# Patient Record
Sex: Female | Born: 1975 | Race: White | Hispanic: No | Smoking: Never smoker
Health system: Southern US, Community
[De-identification: ages and names within clinical notes are randomized; demographics above are authoritative.]

## PROBLEM LIST (undated history)

## (undated) ENCOUNTER — Inpatient Hospital Stay (HOSPITAL_COMMUNITY): Payer: Self-pay

## (undated) DIAGNOSIS — F419 Anxiety disorder, unspecified: Secondary | ICD-10-CM

## (undated) HISTORY — PX: DENTAL EXAMINATION UNDER ANESTHESIA: SHX1447

## (undated) HISTORY — PX: DENTAL SURGERY: SHX609

---

## 2008-11-24 ENCOUNTER — Emergency Department (HOSPITAL_COMMUNITY): Admission: EM | Admit: 2008-11-24 | Discharge: 2008-11-24 | Payer: Self-pay | Admitting: Family Medicine

## 2010-06-02 LAB — POCT URINALYSIS DIP (DEVICE)
Bilirubin Urine: NEGATIVE
Glucose, UA: NEGATIVE mg/dL
Specific Gravity, Urine: 1.02 (ref 1.005–1.030)
Urobilinogen, UA: 0.2 mg/dL (ref 0.0–1.0)
pH: 7 (ref 5.0–8.0)

## 2010-06-02 LAB — GC/CHLAMYDIA PROBE AMP, GENITAL
Chlamydia, DNA Probe: NEGATIVE
GC Probe Amp, Genital: NEGATIVE

## 2010-06-02 LAB — WET PREP, GENITAL
Clue Cells Wet Prep HPF POC: NONE SEEN
Trich, Wet Prep: NONE SEEN

## 2010-06-02 LAB — URINE CULTURE

## 2014-02-26 NOTE — L&D Delivery Note (Signed)
Patient was C/C/+2 and pushed for approx 120 minutes with epidural.   NSVD female infant, Apgars pending, weight 6#13.   The patient had a left labial/periurethral laceration repaired with 3-0vicryl. Fundus was firm. EBL was expected amount. Placenta was delivered intact. Vagina was clear.  Baby was vigorous and doing skin to skin with mother.  Philip AspenALLAHAN, Eugenio Dollins

## 2014-06-18 LAB — OB RESULTS CONSOLE HGB/HCT, BLOOD
HCT: 40 %
Hemoglobin: 14.2 g/dL

## 2014-06-18 LAB — OB RESULTS CONSOLE PLATELET COUNT: PLATELETS: 318 10*3/uL

## 2014-06-18 LAB — OB RESULTS CONSOLE RPR: RPR: NONREACTIVE

## 2014-06-18 LAB — OB RESULTS CONSOLE RUBELLA ANTIBODY, IGM: Rubella: IMMUNE

## 2014-06-18 LAB — OB RESULTS CONSOLE HEPATITIS B SURFACE ANTIGEN: Hepatitis B Surface Ag: NEGATIVE

## 2014-06-18 LAB — OB RESULTS CONSOLE HIV ANTIBODY (ROUTINE TESTING): HIV: NONREACTIVE

## 2014-06-18 LAB — OB RESULTS CONSOLE GC/CHLAMYDIA
CHLAMYDIA, DNA PROBE: NEGATIVE
GC PROBE AMP, GENITAL: NEGATIVE

## 2014-10-15 LAB — OB RESULTS CONSOLE PLATELET COUNT: PLATELETS: 291 10*3/uL

## 2014-10-15 LAB — OB RESULTS CONSOLE HGB/HCT, BLOOD
HEMATOCRIT: 37 %
HEMOGLOBIN: 12.7 g/dL

## 2014-11-08 ENCOUNTER — Inpatient Hospital Stay (HOSPITAL_COMMUNITY)
Admission: AD | Admit: 2014-11-08 | Discharge: 2014-11-08 | Disposition: A | Discharge status: Home / Self Care | Payer: 59 | Source: Ambulatory Visit | Attending: Obstetrics and Gynecology | Admitting: Obstetrics and Gynecology

## 2014-11-08 ENCOUNTER — Encounter (HOSPITAL_COMMUNITY): Payer: Self-pay | Admitting: *Deleted

## 2014-11-08 DIAGNOSIS — IMO0001 Reserved for inherently not codable concepts without codable children: Secondary | ICD-10-CM

## 2014-11-08 DIAGNOSIS — Z79899 Other long term (current) drug therapy: Secondary | ICD-10-CM | POA: Insufficient documentation

## 2014-11-08 DIAGNOSIS — O163 Unspecified maternal hypertension, third trimester: Secondary | ICD-10-CM | POA: Insufficient documentation

## 2014-11-08 DIAGNOSIS — R109 Unspecified abdominal pain: Secondary | ICD-10-CM | POA: Insufficient documentation

## 2014-11-08 DIAGNOSIS — R030 Elevated blood-pressure reading, without diagnosis of hypertension: Secondary | ICD-10-CM

## 2014-11-08 DIAGNOSIS — E876 Hypokalemia: Secondary | ICD-10-CM | POA: Diagnosis not present

## 2014-11-08 DIAGNOSIS — R102 Pelvic and perineal pain: Secondary | ICD-10-CM

## 2014-11-08 DIAGNOSIS — O9989 Other specified diseases and conditions complicating pregnancy, childbirth and the puerperium: Secondary | ICD-10-CM | POA: Diagnosis not present

## 2014-11-08 DIAGNOSIS — N949 Unspecified condition associated with female genital organs and menstrual cycle: Secondary | ICD-10-CM | POA: Diagnosis not present

## 2014-11-08 DIAGNOSIS — F419 Anxiety disorder, unspecified: Secondary | ICD-10-CM | POA: Insufficient documentation

## 2014-11-08 DIAGNOSIS — Z3A32 32 weeks gestation of pregnancy: Secondary | ICD-10-CM | POA: Insufficient documentation

## 2014-11-08 DIAGNOSIS — O26893 Other specified pregnancy related conditions, third trimester: Secondary | ICD-10-CM

## 2014-11-08 HISTORY — DX: Anxiety disorder, unspecified: F41.9

## 2014-11-08 LAB — COMPREHENSIVE METABOLIC PANEL
ALBUMIN: 2.8 g/dL — AB (ref 3.5–5.0)
ALK PHOS: 165 U/L — AB (ref 38–126)
ALT: 10 U/L — ABNORMAL LOW (ref 14–54)
ANION GAP: 12 (ref 5–15)
AST: 16 U/L (ref 15–41)
BILIRUBIN TOTAL: 1 mg/dL (ref 0.3–1.2)
BUN: <5 mg/dL — ABNORMAL LOW (ref 6–20)
CALCIUM: 9.2 mg/dL (ref 8.9–10.3)
CO2: 21 mmol/L — ABNORMAL LOW (ref 22–32)
Chloride: 104 mmol/L (ref 101–111)
Creatinine, Ser: 0.63 mg/dL (ref 0.44–1.00)
GLUCOSE: 100 mg/dL — AB (ref 65–99)
POTASSIUM: 2.8 mmol/L — AB (ref 3.5–5.1)
Sodium: 137 mmol/L (ref 135–145)
TOTAL PROTEIN: 6.6 g/dL (ref 6.5–8.1)

## 2014-11-08 LAB — URINALYSIS, ROUTINE W REFLEX MICROSCOPIC
BILIRUBIN URINE: NEGATIVE
Glucose, UA: NEGATIVE mg/dL
KETONES UR: NEGATIVE mg/dL
NITRITE: NEGATIVE
PH: 7 (ref 5.0–8.0)
Protein, ur: NEGATIVE mg/dL
Specific Gravity, Urine: 1.01 (ref 1.005–1.030)
UROBILINOGEN UA: 0.2 mg/dL (ref 0.0–1.0)

## 2014-11-08 LAB — PROTEIN / CREATININE RATIO, URINE
CREATININE, URINE: 82 mg/dL
Protein Creatinine Ratio: 0.29 mg/mg{Cre} — ABNORMAL HIGH (ref 0.00–0.15)
TOTAL PROTEIN, URINE: 24 mg/dL

## 2014-11-08 LAB — CBC WITH DIFFERENTIAL/PLATELET
BASOS PCT: 0 % (ref 0–1)
Basophils Absolute: 0 10*3/uL (ref 0.0–0.1)
Eosinophils Absolute: 0.1 10*3/uL (ref 0.0–0.7)
Eosinophils Relative: 1 % (ref 0–5)
HEMATOCRIT: 35.9 % — AB (ref 36.0–46.0)
Hemoglobin: 12.4 g/dL (ref 12.0–15.0)
LYMPHS ABS: 2.3 10*3/uL (ref 0.7–4.0)
Lymphocytes Relative: 18 % (ref 12–46)
MCH: 32 pg (ref 26.0–34.0)
MCHC: 34.5 g/dL (ref 30.0–36.0)
MCV: 92.8 fL (ref 78.0–100.0)
MONO ABS: 0.9 10*3/uL (ref 0.1–1.0)
MONOS PCT: 7 % (ref 3–12)
NEUTROS ABS: 9.9 10*3/uL — AB (ref 1.7–7.7)
Neutrophils Relative %: 74 % (ref 43–77)
Platelets: 311 10*3/uL (ref 150–400)
RBC: 3.87 MIL/uL (ref 3.87–5.11)
RDW: 13.1 % (ref 11.5–15.5)
WBC: 13.2 10*3/uL — ABNORMAL HIGH (ref 4.0–10.5)

## 2014-11-08 LAB — LACTATE DEHYDROGENASE: LDH: 144 U/L (ref 98–192)

## 2014-11-08 LAB — URINE MICROSCOPIC-ADD ON

## 2014-11-08 LAB — URIC ACID: URIC ACID, SERUM: 3.4 mg/dL (ref 2.3–6.6)

## 2014-11-08 MED ORDER — POTASSIUM CHLORIDE CRYS ER 20 MEQ PO TBCR
40.0000 meq | EXTENDED_RELEASE_TABLET | Freq: Once | ORAL | Status: AC
  Administered 2014-11-08: 40 meq via ORAL
  Filled 2014-11-08: qty 2

## 2014-11-08 NOTE — MAU Note (Signed)
Pt. States she was at work today and started to have lower abdominal pain that she reports is constant and a dull ache. Pt. Also states she is unable to stand up straight because of this. Denies LOF or bleeding. Pt. Is moving well. Pt. Reports heartburn that is constant as well. BP is 167/94.

## 2014-11-08 NOTE — MAU Provider Note (Signed)
History     CSN: [id number]  Arrival date and time: 11/08/14 1203   First Provider Initiated Contact with Patient 11/08/14 1311      Chief Complaint  Patient presents with  . Abdominal Pain   HPI  Ms. Lindsay [name] is a 39 y.o. G1P0 at [redacted]w[redacted]d who presents to MAU today with complaint of sudden onset midline suprapubic pain at 1120 today. She rates pain at 5/10 now. She states it has improved with rest and worse with ambulation. She denies vaginal bleeding, LOF, contractions, headache, blurred vision or change in peripheral edema. She denies complications with the pregnancy or h/o HTN. She also denies UTI symptoms.   OB History    Gravida Para Term Preterm AB TAB SAB Ectopic Multiple Living   1               Past Medical History  Diagnosis Date  . Anxiety     Past Surgical History  Procedure Laterality Date  . Dental examination under anesthesia      History reviewed. No pertinent family history.  Social History  Substance Use Topics  . Smoking status: Never Smoker   . Smokeless tobacco: None  . Alcohol Use: No    Allergies: No Known Allergies  Prescriptions prior to admission  Medication Sig Dispense Refill Last Dose  . mirtazapine (REMERON) 45 MG tablet Take 45 mg by mouth at bedtime.   11/07/2014 at Unknown time  . Prenatal Vit-Fe Fumarate-FA (MULTIVITAMIN-PRENATAL) 27-0.8 MG TABS tablet Take 1 tablet by mouth daily at 12 noon.   11/07/2014 at Unknown time    Review of Systems  Constitutional: Negative for fever and malaise/fatigue.  Eyes: Negative for blurred vision.  Gastrointestinal: Positive for abdominal pain. Negative for nausea, vomiting, diarrhea and constipation.  Genitourinary: Negative for dysuria, urgency and frequency.       Neg - vaginal bleeding, discharge, LOF  Neurological: Negative for headaches.   Physical Exam   Blood pressure 133/81, pulse 81, temperature 98.4 F (36.9 C), temperature source Oral, resp. rate 18, height 5\' 9"  (1.753 m),  weight 176 lb (79.833 kg), last menstrual period 03/23/2014, SpO2 97 %.  Physical Exam  Nursing note and vitals reviewed. Constitutional: She is oriented to person, place, and time. She appears well-developed and well-nourished. No distress.  HENT:  Head: Normocephalic and atraumatic.  Cardiovascular: Normal rate.   Respiratory: Effort normal.  GI: Soft. She exhibits no distension and no mass. There is tenderness (mild tenderness to palpation of the midline suprapubic region). There is no rebound and no guarding.  Musculoskeletal: She exhibits edema (mild non-pitting lower extremity edema equal bilaterally).  Neurological: She is alert and oriented to person, place, and time. She has normal reflexes.  No clonus  Skin: Skin is warm and dry. No erythema.  Psychiatric: She has a normal mood and affect.   Dilation: Closed Effacement (%): Thick Cervical Position: Posterior Exam by:: Vonzella Nipple, PA   Results for orders placed or performed during the hospital encounter of 11/08/14 (from the past 24 hour(s))  Urinalysis, Routine w reflex microscopic (not at [facility])     Status: Abnormal   Collection Time: 11/08/14 12:34 PM  Result Value Ref Range   Color, Urine YELLOW YELLOW   APPearance CLOUDY (A) CLEAR   Specific Gravity, Urine 1.010 1.005 - 1.030   pH 7.0 5.0 - 8.0   Glucose, UA NEGATIVE NEGATIVE mg/dL   Hgb urine dipstick TRACE (A) NEGATIVE   Bilirubin Urine NEGATIVE NEGATIVE  Ketones, ur NEGATIVE NEGATIVE mg/dL   Protein, ur NEGATIVE NEGATIVE mg/dL   Urobilinogen, UA 0.2 0.0 - 1.0 mg/dL   Nitrite NEGATIVE NEGATIVE   Leukocytes, UA MODERATE (A) NEGATIVE  Urine microscopic-add on     Status: Abnormal   Collection Time: 11/08/14 12:34 PM  Result Value Ref Range   Squamous Epithelial / LPF MANY (A) RARE   WBC, UA 3-6 <3 WBC/hpf   Bacteria, UA FEW (A) RARE  Protein / creatinine ratio, urine     Status: Abnormal   Collection Time: 11/08/14 12:34 PM  Result Value Ref Range    Creatinine, Urine 82.00 mg/dL   Total Protein, Urine 24 mg/dL   Protein Creatinine Ratio 0.29 (H) 0.00 - 0.15 mg/mg[Cre]  CBC with Differential/Platelet     Status: Abnormal   Collection Time: 11/08/14  1:26 PM  Result Value Ref Range   WBC 13.2 (H) 4.0 - 10.5 K/uL   RBC 3.87 3.87 - 5.11 MIL/uL   Hemoglobin 12.4 12.0 - 15.0 g/dL   HCT 13.0 (L) 86.5 - 78.4 %   MCV 92.8 78.0 - 100.0 fL   MCH 32.0 26.0 - 34.0 pg   MCHC 34.5 30.0 - 36.0 g/dL   RDW 69.6 29.5 - 28.4 %   Platelets 311 150 - 400 K/uL   Neutrophils Relative % 74 43 - 77 %   Neutro Abs 9.9 (H) 1.7 - 7.7 K/uL   Lymphocytes Relative 18 12 - 46 %   Lymphs Abs 2.3 0.7 - 4.0 K/uL   Monocytes Relative 7 3 - 12 %   Monocytes Absolute 0.9 0.1 - 1.0 K/uL   Eosinophils Relative 1 0 - 5 %   Eosinophils Absolute 0.1 0.0 - 0.7 K/uL   Basophils Relative 0 0 - 1 %   Basophils Absolute 0.0 0.0 - 0.1 K/uL  Comprehensive metabolic panel     Status: Abnormal   Collection Time: 11/08/14  1:26 PM  Result Value Ref Range   Sodium 137 135 - 145 mmol/L   Potassium 2.8 (L) 3.5 - 5.1 mmol/L   Chloride 104 101 - 111 mmol/L   CO2 21 (L) 22 - 32 mmol/L   Glucose, Bld 100 (H) 65 - 99 mg/dL   BUN <5 (L) 6 - 20 mg/dL   Creatinine, Ser 1.32 0.44 - 1.00 mg/dL   Calcium 9.2 8.9 - 44.0 mg/dL   Total Protein 6.6 6.5 - 8.1 g/dL   Albumin 2.8 (L) 3.5 - 5.0 g/dL   AST 16 15 - 41 U/L   ALT 10 (L) 14 - 54 U/L   Alkaline Phosphatase 165 (H) 38 - 126 U/L   Total Bilirubin 1.0 0.3 - 1.2 mg/dL   GFR calc non Af Amer >60 >60 mL/min   GFR calc Af Amer >60 >60 mL/min   Anion gap 12 5 - 15  Uric acid     Status: None   Collection Time: 11/08/14  1:26 PM  Result Value Ref Range   Uric Acid, Serum 3.4 2.3 - 6.6 mg/dL  Lactate dehydrogenase     Status: None   Collection Time: 11/08/14  1:26 PM  Result Value Ref Range   LDH 144 98 - 192 U/L   Patient Vitals for the past 24 hrs:  BP Temp Temp src Pulse Resp SpO2 Height Weight  11/08/14 1500 133/81 mmHg - -  81 18 97 % - -  11/08/14 1446 130/86 mmHg - - 83 - 97 % - -  11/08/14  1336 135/95 mmHg - - 104 - - - -  11/08/14 1300 (!) 141/103 mmHg - - 120 - 98 % - -  11/08/14 1227 167/94 mmHg 98.4 F (36.9 C) Oral 91 18 - 5\' 9"  (1.753 m) 176 lb (79.833 kg)    Fetal Monitoring: Baseline: 130 bpm, moderate variability, + accelerations, no decelerations Contractions: none  MAU Course  Procedures None  MDM UA today Urine culture pending CBC, CMP, Uric Acid, LDH and urine protein/creatinine ratio ordered due to elevated BP Patient denies history of HTN. Does have anxiety disorder and is extremely anxious right now.  Discussed with Dr. Claiborne Billings, she recommends KDur 40 mEq PO in MAU, Pre-eclampsia precautions and follow-up 9/14 as scheduled. They will recheck BP at that time.   Assessment and Plan  A: SIUP at [redacted]w[redacted]d Elevated BP in pregnancy Hypokalemia  P: Discharge home Tylenol PRN advised for pain Discussed use of abdominal binder and warm bath/shower for pain relief Pre-eclampsia precautions discussed Patient advised to follow-up with Dr. Claiborne Billings as scheduled on 11/10/14 or sooner if symptoms change or worsen Patient may return to MAU as needed or if her condition were to change or worsen   Marny Lowenstein, PA-C  11/08/2014, 3:50 PM

## 2014-11-08 NOTE — MAU Note (Signed)
Lab called and unable to do urine c/r on collected specimen. Will have pt. Void again if possible.

## 2014-11-08 NOTE — MAU Note (Signed)
Called pharmacy about medication.  They will send now.

## 2014-11-08 NOTE — MAU Note (Signed)
Patient presents at [redacted] weeks gestation with c/o lower abdominal pain since this morning. Went to the office but the ultrasound tech was at lunch and her OB didn't want her to wait until the tech's return. Fetus active. Denies bleeding or discharge.

## 2014-11-08 NOTE — Discharge Instructions (Signed)
Abdominal Pain During Pregnancy °Belly (abdominal) pain is common during pregnancy. Most of the time, it is not a serious problem. Other times, it can be a sign that something is wrong with the pregnancy. Always tell your doctor if you have belly pain. °HOME CARE °Monitor your belly pain for any changes. The following actions may help you feel better: °· Do not have sex (intercourse) or put anything in your vagina until you feel better. °· Rest until your pain stops. °· Drink clear fluids if you feel sick to your stomach (nauseous). Do not eat solid food until you feel better. °· Only take medicine as told by your doctor. °· Keep all doctor visits as told. °GET HELP RIGHT AWAY IF:  °· You are bleeding, leaking fluid, or pieces of tissue come out of your vagina. °· You have more pain or cramping. °· You keep throwing up (vomiting). °· You have pain when you pee (urinate) or have blood in your pee. °· You have a fever. °· You do not feel your baby moving as much. °· You feel very weak or feel like passing out. °· You have trouble breathing, with or without belly pain. °· You have a very bad headache and belly pain. °· You have fluid leaking from your vagina and belly pain. °· You keep having watery poop (diarrhea). °· Your belly pain does not go away after resting, or the pain gets worse. °MAKE SURE YOU:  °· Understand these instructions. °· Will watch your condition. °· Will get help right away if you are not doing well or get worse. °Document Released: 01/31/2009 Document Revised: 10/15/2012 Document Reviewed: 09/11/2012 °ExitCare® Patient Information ©2015 [employer]. This information is not intended to replace advice given to you by your health care provider. Make sure you discuss any questions you have with your health care provider. °Preeclampsia and Eclampsia °Preeclampsia is a serious condition that develops only during pregnancy. It is also called toxemia of pregnancy. This condition causes high blood  pressure along with other symptoms, such as swelling and headaches. These may develop as the condition gets worse. Preeclampsia may occur 20 weeks or later into your pregnancy.  °Diagnosing and treating preeclampsia early is very important. If not treated early, it can cause serious problems for you and your baby. One problem it can lead to is eclampsia, which is a condition that causes muscle jerking or shaking (convulsions) in the mother. Delivering your baby is the best treatment for preeclampsia or eclampsia.  °RISK FACTORS °The cause of preeclampsia is not known. You may be more likely to develop preeclampsia if you have certain risk factors. These include:  °· Being pregnant for the first time. °· Having preeclampsia in a past pregnancy. °· Having a family history of preeclampsia. °· Having high blood pressure. °· Being pregnant with twins or triplets. °· Being 35 or older. °· Being African American. °· Having kidney disease or diabetes. °· Having medical conditions such as lupus or blood diseases. °· Being very overweight (obese). °SIGNS AND SYMPTOMS  °The earliest signs of preeclampsia are: °· High blood pressure. °· Increased protein in your urine. Your health care provider will check for this at every prenatal visit. °Other symptoms that can develop include:  °· Severe headaches. °· Sudden weight gain. °· Swelling of your hands, face, legs, and feet. °· Feeling sick to your stomach (nauseous) and throwing up (vomiting). °· Vision problems (blurred or double vision). °· Numbness in your face, arms, legs, and feet. °·   Dizziness.  Slurred speech.  Sensitivity to bright lights.  Abdominal pain. DIAGNOSIS  There are no screening tests for preeclampsia. Your health care provider will ask you about symptoms and check for signs of preeclampsia during your prenatal visits. You may also have tests, including:  Urine testing.  Blood testing.  Checking your baby's heart rate.  Checking the health of  your baby and your placenta using images created with sound waves (ultrasound). TREATMENT  You can work out the best treatment approach together with your health care provider. It is very important to keep all prenatal appointments. If you have an increased risk of preeclampsia, you may need more frequent prenatal exams.  Your health care provider may prescribe bed rest.  You may have to eat as little salt as possible.  You may need to take medicine to lower your blood pressure if the condition does not respond to more conservative measures.  You may need to stay in the hospital if your condition is severe. There, treatment will focus on controlling your blood pressure and fluid retention. You may also need to take medicine to prevent seizures.  If the condition gets worse, your baby may need to be delivered early to protect you and the baby. You may have your labor started with medicine (be induced), or you may have a cesarean delivery.  Preeclampsia usually goes away after the baby is born. HOME CARE INSTRUCTIONS   Only take over-the-counter or prescription medicines as directed by your health care provider.  Lie on your left side while resting. This keeps pressure off your baby.  Elevate your feet while resting.  Get regular exercise. Ask your health care provider what type of exercise is safe for you.  Avoid caffeine and alcohol.  Do not smoke.  Drink 6-8 glasses of water every day.  Eat a balanced diet that is low in salt. Do not add salt to your food.  Avoid stressful situations as much as possible.  Get plenty of rest and sleep.  Keep all prenatal appointments and tests as scheduled. SEEK MEDICAL CARE IF:  You are gaining more weight than expected.  You have any headaches, abdominal pain, or nausea.  You are bruising more than usual.  You feel dizzy or light-headed. SEEK IMMEDIATE MEDICAL CARE IF:   You develop sudden or severe swelling anywhere in your body.  This usually happens in the legs.  You gain 5 lb (2.3 kg) or more in a week.  You have a severe headache, dizziness, problems with your vision, or confusion.  You have severe abdominal pain.  You have lasting nausea or vomiting.  You have a seizure.  You have trouble moving any part of your body.  You develop numbness in your body.  You have trouble speaking.  You have any abnormal bleeding.  You develop a stiff neck.  You pass out. MAKE SURE YOU:   Understand these instructions.  Will watch your condition.  Will get help right away if you are not doing well or get worse. Document Released: 02/10/2000 Document Revised: 02/17/2013 Document Reviewed: 12/05/2012 [facility] Patient Information 2015 [place], [place]. This information is not intended to replace advice given to you by your health care provider. Make sure you discuss any questions you have with your health care provider.

## 2014-11-10 LAB — CULTURE, OB URINE

## 2014-11-25 LAB — OB RESULTS CONSOLE GBS: GBS: NEGATIVE

## 2014-12-06 ENCOUNTER — Inpatient Hospital Stay (HOSPITAL_COMMUNITY)
Admission: AD | Admit: 2014-12-06 | Discharge: 2014-12-11 | DRG: 775 | Disposition: A | Discharge status: Home / Self Care | Payer: Managed Care, Other (non HMO) | Source: Ambulatory Visit | Attending: Obstetrics and Gynecology | Admitting: Obstetrics and Gynecology

## 2014-12-06 ENCOUNTER — Encounter (HOSPITAL_COMMUNITY): Payer: Self-pay | Admitting: *Deleted

## 2014-12-06 DIAGNOSIS — F419 Anxiety disorder, unspecified: Secondary | ICD-10-CM | POA: Diagnosis present

## 2014-12-06 DIAGNOSIS — O99344 Other mental disorders complicating childbirth: Secondary | ICD-10-CM | POA: Diagnosis present

## 2014-12-06 DIAGNOSIS — O9962 Diseases of the digestive system complicating childbirth: Secondary | ICD-10-CM | POA: Diagnosis present

## 2014-12-06 DIAGNOSIS — O134 Gestational [pregnancy-induced] hypertension without significant proteinuria, complicating childbirth: Secondary | ICD-10-CM | POA: Diagnosis present

## 2014-12-06 DIAGNOSIS — K219 Gastro-esophageal reflux disease without esophagitis: Secondary | ICD-10-CM | POA: Diagnosis present

## 2014-12-06 DIAGNOSIS — R030 Elevated blood-pressure reading, without diagnosis of hypertension: Secondary | ICD-10-CM | POA: Diagnosis present

## 2014-12-06 DIAGNOSIS — O139 Gestational [pregnancy-induced] hypertension without significant proteinuria, unspecified trimester: Secondary | ICD-10-CM | POA: Diagnosis present

## 2014-12-06 DIAGNOSIS — Z3A36 36 weeks gestation of pregnancy: Secondary | ICD-10-CM

## 2014-12-06 LAB — URINALYSIS, ROUTINE W REFLEX MICROSCOPIC
Bilirubin Urine: NEGATIVE
GLUCOSE, UA: NEGATIVE mg/dL
Hgb urine dipstick: NEGATIVE
Ketones, ur: NEGATIVE mg/dL
Nitrite: NEGATIVE
PROTEIN: NEGATIVE mg/dL
SPECIFIC GRAVITY, URINE: 1.015 (ref 1.005–1.030)
Urobilinogen, UA: 0.2 mg/dL (ref 0.0–1.0)
pH: 8 (ref 5.0–8.0)

## 2014-12-06 LAB — COMPREHENSIVE METABOLIC PANEL
ALBUMIN: 2.9 g/dL — AB (ref 3.5–5.0)
ALK PHOS: 178 U/L — AB (ref 38–126)
ALT: 8 U/L — ABNORMAL LOW (ref 14–54)
AST: 18 U/L (ref 15–41)
Anion gap: 8 (ref 5–15)
BILIRUBIN TOTAL: 0.9 mg/dL (ref 0.3–1.2)
BUN: 7 mg/dL (ref 6–20)
CALCIUM: 8.8 mg/dL — AB (ref 8.9–10.3)
CO2: 22 mmol/L (ref 22–32)
Chloride: 108 mmol/L (ref 101–111)
Creatinine, Ser: 0.69 mg/dL (ref 0.44–1.00)
GFR calc Af Amer: >60 mL/min (ref >60)
GFR calc non Af Amer: >60 mL/min (ref >60)
GLUCOSE: 90 mg/dL (ref 65–99)
Potassium: 2.8 mmol/L — ABNORMAL LOW (ref 3.5–5.1)
Sodium: 138 mmol/L (ref 135–145)
TOTAL PROTEIN: 6.7 g/dL (ref 6.5–8.1)

## 2014-12-06 LAB — OB RESULTS CONSOLE GC/CHLAMYDIA
Chlamydia: NEGATIVE
GC PROBE AMP, GENITAL: NEGATIVE

## 2014-12-06 LAB — OB RESULTS CONSOLE ABO/RH: RH Type: POSITIVE

## 2014-12-06 LAB — ABO/RH: ABO/RH(D): A POS

## 2014-12-06 LAB — LACTATE DEHYDROGENASE: LDH: 163 U/L (ref 98–192)

## 2014-12-06 LAB — CBC
HEMATOCRIT: 35.6 % — AB (ref 36.0–46.0)
HEMOGLOBIN: 11.7 g/dL — AB (ref 12.0–15.0)
MCH: 30.6 pg (ref 26.0–34.0)
MCHC: 32.9 g/dL (ref 30.0–36.0)
MCV: 93.2 fL (ref 78.0–100.0)
Platelets: 298 10*3/uL (ref 150–400)
RBC: 3.82 MIL/uL — ABNORMAL LOW (ref 3.87–5.11)
RDW: 13.9 % (ref 11.5–15.5)
WBC: 11.8 10*3/uL — AB (ref 4.0–10.5)

## 2014-12-06 LAB — TYPE AND SCREEN
ABO/RH(D): A POS
Antibody Screen: NEGATIVE

## 2014-12-06 LAB — URIC ACID: Uric Acid, Serum: 4.7 mg/dL (ref 2.3–6.6)

## 2014-12-06 LAB — PROTEIN / CREATININE RATIO, URINE
Creatinine, Urine: 168 mg/dL
Protein Creatinine Ratio: 0.18 mg/mg{Cre} — ABNORMAL HIGH (ref 0.00–0.15)
Total Protein, Urine: 30 mg/dL

## 2014-12-06 LAB — URINE MICROSCOPIC-ADD ON

## 2014-12-06 MED ORDER — ACETAMINOPHEN 325 MG PO TABS
650.0000 mg | ORAL_TABLET | ORAL | Status: DC | PRN
  Administered 2014-12-08 (×2): 650 mg via ORAL
  Filled 2014-12-06 (×2): qty 2

## 2014-12-06 MED ORDER — ONDANSETRON HCL 4 MG/2ML IJ SOLN
4.0000 mg | Freq: Four times a day (QID) | INTRAMUSCULAR | Status: DC | PRN
  Administered 2014-12-07 (×2): 4 mg via INTRAVENOUS
  Filled 2014-12-06 (×2): qty 2

## 2014-12-06 MED ORDER — ZOLPIDEM TARTRATE 5 MG PO TABS
5.0000 mg | ORAL_TABLET | Freq: Every evening | ORAL | Status: DC | PRN
  Administered 2014-12-07: 5 mg via ORAL
  Filled 2014-12-06 (×2): qty 1

## 2014-12-06 MED ORDER — OXYTOCIN 40 UNITS IN LACTATED RINGERS INFUSION - SIMPLE MED
62.5000 mL/h | INTRAVENOUS | Status: DC
  Filled 2014-12-06: qty 1000

## 2014-12-06 MED ORDER — OXYTOCIN BOLUS FROM INFUSION: 500.0000 mL | INTRAVENOUS | Status: DC

## 2014-12-06 MED ORDER — LACTATED RINGERS IV SOLN: 500.0000 mL | INTRAVENOUS | Status: DC | PRN

## 2014-12-06 MED ORDER — CITRIC ACID-SODIUM CITRATE 334-500 MG/5ML PO SOLN
30.0000 mL | ORAL | Status: DC | PRN
  Administered 2014-12-07: 30 mL via ORAL
  Filled 2014-12-06: qty 15

## 2014-12-06 MED ORDER — LIDOCAINE HCL (PF) 1 % IJ SOLN
30.0000 mL | INTRAMUSCULAR | Status: DC | PRN
  Filled 2014-12-06: qty 30

## 2014-12-06 MED ORDER — MISOPROSTOL 25 MCG QUARTER TABLET
25.0000 ug | ORAL_TABLET | ORAL | Status: DC | PRN
  Administered 2014-12-07 (×3): 25 ug via VAGINAL
  Filled 2014-12-06 (×3): qty 0.25

## 2014-12-06 MED ORDER — OXYCODONE-ACETAMINOPHEN 5-325 MG PO TABS: 1.0000 | ORAL_TABLET | ORAL | Status: DC | PRN

## 2014-12-06 MED ORDER — OXYCODONE-ACETAMINOPHEN 5-325 MG PO TABS: 2.0000 | ORAL_TABLET | ORAL | Status: DC | PRN

## 2014-12-06 MED ORDER — TERBUTALINE SULFATE 1 MG/ML IJ SOLN: 0.2500 mg | Freq: Once | INTRAMUSCULAR | Status: DC | PRN

## 2014-12-06 MED ORDER — LACTATED RINGERS IV SOLN
INTRAVENOUS | Status: DC
  Administered 2014-12-06 – 2014-12-08 (×4): via INTRAVENOUS
  Administered 2014-12-08: 125 mL/h via INTRAVENOUS

## 2014-12-06 MED ORDER — BUTORPHANOL TARTRATE 1 MG/ML IJ SOLN
1.0000 mg | INTRAMUSCULAR | Status: DC | PRN
  Administered 2014-12-07 – 2014-12-08 (×5): 1 mg via INTRAVENOUS
  Filled 2014-12-06 (×7): qty 1

## 2014-12-06 NOTE — MAU Note (Signed)
Pt presents to MAU from physicians office for increase in blood pressure. Says she is suppose to be induced tomorrow for an increase in blood pressure

## 2014-12-06 NOTE — H&P (Signed)
Lindsay [name] is a 39 y.o. female presenting for elevated blood pressures  39 year old gravida 1 para 0 at 36+6 weeks by LMP and confirmed by first trimester ultrasound presents to maternity admissions from the office for evaluation of elevated blood pressures. Over the last few days she's been experiencing nausea, vomiting. In the office today she had lost about 3 pounds from her last visit Her blood pressures in the office today were 140 over 100.  She has been followed for gestational hypertension in the office since 11/08/2014. Her blood pressures have been labile ranging from 120/80-160/100. She was given labetalol however she felt like she was drunk when she took this medication.   She has a history of significant anxiety and PTSD She. She has been taking mirtazapine during the pregnancy to manage her anxiety. She continues to follow with a therapist. History OB History    Gravida Para Term Preterm AB TAB SAB Ectopic Multiple Living   1         0     Past Medical History  Diagnosis Date  . Anxiety    Past Surgical History  Procedure Laterality Date  . Dental examination under anesthesia     Family History: family history is not on file. Social History:  reports that she has never smoked. She does not have any smokeless tobacco history on file. She reports that she does not drink alcohol or use illicit drugs.   Prenatal Transfer Tool  Maternal Diabetes: No. Abnormal 1 hour, 3 hour within normal limits Genetic Screening: Normal NIPT low risk female Maternal Ultrasounds/Referrals: Normal Fetal Ultrasounds or other Referrals:  None Maternal Substance Abuse:  No Significant Maternal Medications:  None Significant Maternal Lab Results:  None Other Comments:  None  ROS: as above    Blood pressure 137/92, pulse 87, temperature 98.2 F (36.8 C), resp. rate 18, height  (1.702 m), weight 178 lb (80.74 kg), last menstrual period 03/23/2014. Exam Physical Exam  Prenatal  labs: ABO, Rh:   A pos Antibody:  Neg Rubella:  Imm RPR:   NR HBsAg:   Neg HIV:   NR GBS:   Neg  Assessment/Plan: 1) Admit 2) Hydralazine IV prn severe range pressures 3) misoprostal PV Q 4 hr 4) Epidural on request   Trevaughn Schear H. 12/06/2014, 7:26 PM

## 2014-12-06 NOTE — MAU Provider Note (Signed)
History     CSN: [id number]  Arrival date and time: 12/06/14 1709   First Provider Initiated Contact with Patient 12/06/14 1817      Chief Complaint  Patient presents with  . Hypertension   HPI   Lindsay [name] is a 39 y.o. female G1P0 at [redacted]w[redacted]d presenting to MAU for PIH labs. She was seen in her MD's office today and her BP's were elevated. She has had PIH with this pregnancy and the plan is to induce her tomorrow.   She has been vomiting since 10 pm last night; she was unable to keep her lunch down today. Denies diarrhea. She feels that her vomiting is related to acid reflux, however does not want to take anything for the symptoms. Vomiting is not an uncommon thing with this pregnancy.   OB History    Gravida Para Term Preterm AB TAB SAB Ectopic Multiple Living   1         0      Past Medical History  Diagnosis Date  . Anxiety     Past Surgical History  Procedure Laterality Date  . Dental examination under anesthesia      History reviewed. No pertinent family history.  Social History  Substance Use Topics  . Smoking status: Never Smoker   . Smokeless tobacco: None  . Alcohol Use: No    Allergies: No Known Allergies  Prescriptions prior to admission  Medication Sig Dispense Refill Last Dose  . mirtazapine (REMERON) 45 MG tablet Take 45 mg by mouth at bedtime.   11/07/2014 at Unknown time  . Prenatal Vit-Fe Fumarate-FA (MULTIVITAMIN-PRENATAL) 27-0.8 MG TABS tablet Take 1 tablet by mouth daily at 12 noon.   11/07/2014 at Unknown time   Results for orders placed or performed during the hospital encounter of 12/06/14 (from the past 24 hour(s))  Urinalysis, Routine w reflex microscopic (not at [facility])     Status: Abnormal   Collection Time: 12/06/14  5:30 PM  Result Value Ref Range   Color, Urine YELLOW YELLOW   APPearance HAZY (A) CLEAR   Specific Gravity, Urine 1.015 1.005 - 1.030   pH 8.0 5.0 - 8.0   Glucose, UA NEGATIVE NEGATIVE mg/dL   Hgb urine dipstick  NEGATIVE NEGATIVE   Bilirubin Urine NEGATIVE NEGATIVE   Ketones, ur NEGATIVE NEGATIVE mg/dL   Protein, ur NEGATIVE NEGATIVE mg/dL   Urobilinogen, UA 0.2 0.0 - 1.0 mg/dL   Nitrite NEGATIVE NEGATIVE   Leukocytes, UA SMALL (A) NEGATIVE  Protein / creatinine ratio, urine     Status: Abnormal   Collection Time: 12/06/14  5:30 PM  Result Value Ref Range   Creatinine, Urine 168.00 mg/dL   Total Protein, Urine 30 mg/dL   Protein Creatinine Ratio 0.18 (H) 0.00 - 0.15 mg/mg[Cre]  Urine microscopic-add on     Status: Abnormal   Collection Time: 12/06/14  5:30 PM  Result Value Ref Range   Squamous Epithelial / LPF FEW (A) RARE   Urine-Other AMORPHOUS URATES/PHOSPHATES   CBC     Status: Abnormal   Collection Time: 12/06/14  6:10 PM  Result Value Ref Range   WBC 11.8 (H) 4.0 - 10.5 K/uL   RBC 3.82 (L) 3.87 - 5.11 MIL/uL   Hemoglobin 11.7 (L) 12.0 - 15.0 g/dL   HCT 40.9 (L) 81.1 - 91.4 %   MCV 93.2 78.0 - 100.0 fL   MCH 30.6 26.0 - 34.0 pg   MCHC 32.9 30.0 - 36.0 g/dL   RDW 13.9  11.5 - 15.5 %   Platelets 298 150 - 400 K/uL  Comprehensive metabolic panel     Status: Abnormal   Collection Time: 12/06/14  6:10 PM  Result Value Ref Range   Sodium 138 135 - 145 mmol/L   Potassium 2.8 (L) 3.5 - 5.1 mmol/L   Chloride 108 101 - 111 mmol/L   CO2 22 22 - 32 mmol/L   Glucose, Bld 90 65 - 99 mg/dL   BUN 7 6 - 20 mg/dL   Creatinine, Ser 1.91 0.44 - 1.00 mg/dL   Calcium 8.8 (L) 8.9 - 10.3 mg/dL   Total Protein 6.7 6.5 - 8.1 g/dL   Albumin 2.9 (L) 3.5 - 5.0 g/dL   AST 18 15 - 41 U/L   ALT 8 (L) 14 - 54 U/L   Alkaline Phosphatase 178 (H) 38 - 126 U/L   Total Bilirubin 0.9 0.3 - 1.2 mg/dL   GFR calc non Af Amer >60 >60 mL/min   GFR calc Af Amer >60 >60 mL/min   Anion gap 8 5 - 15  Uric acid     Status: None   Collection Time: 12/06/14  6:10 PM  Result Value Ref Range   Uric Acid, Serum 4.7 2.3 - 6.6 mg/dL  Lactate dehydrogenase     Status: None   Collection Time: 12/06/14  6:10 PM  Result  Value Ref Range   LDH 163 98 - 192 U/L    Review of Systems  Constitutional: Negative for fever.  Eyes: Negative for blurred vision, double vision and photophobia.  Cardiovascular: Negative for chest pain.  Gastrointestinal: Negative for abdominal pain.  Genitourinary: Negative for dysuria.  Neurological: Negative for headaches.   Physical Exam   Blood pressure 134/93, pulse 87, temperature 98.2 F (36.8 C), resp. rate 18, height  (1.702 m), weight 80.74 kg (178 lb), last menstrual period 03/23/2014.   Patient Vitals for the past 24 hrs:  BP Temp Pulse Resp Height Weight  12/06/14 1902 134/93 mmHg - 87 - - -  12/06/14 1847 135/96 mmHg - 88 - - -  12/06/14 1832 141/100 mmHg - 94 - - -  12/06/14 1816 140/94 mmHg - 89 - - -  12/06/14 1801 133/99 mmHg - 107 - - -  12/06/14 1746 129/99 mmHg - 100 - - -  12/06/14 1741 134/97 mmHg - 94 18 - -  12/06/14 1726 146/89 mmHg 98.2 F (36.8 C) 87 18  (1.702 m) 80.74 kg (178 lb)    Physical Exam  Constitutional: She is oriented to person, place, and time. She appears well-developed and well-nourished. No distress.  HENT:  Head: Normocephalic.  Eyes: Pupils are equal, round, and reactive to light.  Cardiovascular: Normal rate.   Respiratory: Effort normal.  GI: Soft. There is no tenderness.  Musculoskeletal: Normal range of motion.  Neurological: She is alert and oriented to person, place, and time. She displays abnormal reflex.  Reflex Scores:      Patellar reflexes are 3+ on the right side and 3+ on the left side. Negative clonus   Skin: Skin is warm. She is not diaphoretic.  Psychiatric: Her behavior is normal.    Fetal Tracing: Baseline: 120 bpm  Variability: Moderate  Accelerations: 15x15 Decelerations: none Toco: UI   MAU Course  Procedures  None   MDM  CBC, CMP, Uric Acid, LDH and urine protein/creatinine ratio ordered due to elevated BP Discussed patient with Dr. Tenny Craw.   Assessment and Plan  A:  Gestational HTN  @ [redacted]w[redacted]d with diastolic pressures approaching severe range.  Patient is scheduled to be induced tomorrow @ 37 weeks.  Hypokalemia   P: Admit to L&D per Dr. Tenny Craw.   Duane Lope, NP 12/06/2014 7:16 PM

## 2014-12-07 LAB — RPR: RPR Ser Ql: NONREACTIVE

## 2014-12-07 MED ORDER — BUTORPHANOL TARTRATE 1 MG/ML IJ SOLN
1.0000 mg | Freq: Once | INTRAMUSCULAR | Status: AC
  Administered 2014-12-07: 1 mg via INTRAVENOUS

## 2014-12-07 MED ORDER — TERBUTALINE SULFATE 1 MG/ML IJ SOLN: 0.2500 mg | Freq: Once | INTRAMUSCULAR | Status: DC | PRN

## 2014-12-07 MED ORDER — OXYTOCIN 40 UNITS IN LACTATED RINGERS INFUSION - SIMPLE MED
1.0000 m[IU]/min | INTRAVENOUS | Status: DC
  Administered 2014-12-07: 2 m[IU]/min via INTRAVENOUS

## 2014-12-07 NOTE — Progress Notes (Signed)
37.0 IOL for GHTN Pt without complaints. FHT 135 Cat 1 TOCO q2 SVE 1/50/-2 Foley bulb placed Continue pitocin augmentation OK given for 1 dose of IV pain medication Epidural when desired Anticipate AROM once foley bulb out

## 2014-12-08 ENCOUNTER — Inpatient Hospital Stay (HOSPITAL_COMMUNITY): Payer: Managed Care, Other (non HMO) | Admitting: Anesthesiology

## 2014-12-08 ENCOUNTER — Encounter (HOSPITAL_COMMUNITY): Payer: Self-pay | Admitting: Anesthesiology

## 2014-12-08 LAB — CBC
HCT: 37.6 % (ref 36.0–46.0)
Hemoglobin: 12.4 g/dL (ref 12.0–15.0)
MCH: 31 pg (ref 26.0–34.0)
MCHC: 33 g/dL (ref 30.0–36.0)
MCV: 94 fL (ref 78.0–100.0)
PLATELETS: 321 10*3/uL (ref 150–400)
RBC: 4 MIL/uL (ref 3.87–5.11)
RDW: 14 % (ref 11.5–15.5)
WBC: 23.5 10*3/uL — AB (ref 4.0–10.5)

## 2014-12-08 MED ORDER — EPHEDRINE 5 MG/ML INJ: 10.0000 mg | INTRAVENOUS | Status: DC | PRN

## 2014-12-08 MED ORDER — HYDRALAZINE HCL 20 MG/ML IJ SOLN
5.0000 mg | INTRAMUSCULAR | Status: AC | PRN
  Administered 2014-12-08 (×2): 10 mg via INTRAVENOUS
  Filled 2014-12-08: qty 1

## 2014-12-08 MED ORDER — LIDOCAINE HCL (PF) 1 % IJ SOLN
INTRAMUSCULAR | Status: DC | PRN
  Administered 2014-12-08 (×2): 4 mL via EPIDURAL

## 2014-12-08 MED ORDER — SODIUM CHLORIDE 0.9 % IV SOLN
3.0000 g | Freq: Four times a day (QID) | INTRAVENOUS | Status: DC
  Administered 2014-12-08 – 2014-12-09 (×4): 3 g via INTRAVENOUS
  Filled 2014-12-08 (×6): qty 3

## 2014-12-08 MED ORDER — FENTANYL 2.5 MCG/ML BUPIVACAINE 1/10 % EPIDURAL INFUSION (WH - ANES)
14.0000 mL/h | INTRAMUSCULAR | Status: DC | PRN
  Administered 2014-12-08: 14.5 mL/h via EPIDURAL
  Administered 2014-12-08 – 2014-12-09 (×3): 14 mL/h via EPIDURAL
  Filled 2014-12-08 (×4): qty 125

## 2014-12-08 MED ORDER — PHENYLEPHRINE 40 MCG/ML (10ML) SYRINGE FOR IV PUSH (FOR BLOOD PRESSURE SUPPORT)
80.0000 ug | PREFILLED_SYRINGE | INTRAVENOUS | Status: DC | PRN
  Filled 2014-12-08: qty 20

## 2014-12-08 MED ORDER — DIPHENHYDRAMINE HCL 50 MG/ML IJ SOLN: 12.5000 mg | INTRAMUSCULAR | Status: DC | PRN

## 2014-12-08 NOTE — Progress Notes (Signed)
Lindsay [name] is a 39 y.o. G1P0 at 4256w1d by LMP admitted for induction of labor due to Hypertension.  Subjective: Patient comfortable  Objective: BP 146/98 mmHg  Pulse 111  Temp(Src) 98.1 F (36.7 C) (Oral)  Resp 20  Ht 5\' 7"  (1.702 m)  Wt 80.74 kg (178 lb)  BMI 27.87 kg/m2  SpO2 98%  LMP 03/23/2014   Total I/O In: -  Out: 2000 [Urine:2000]  FHT:  FHR: 150 bpm, variability: minimal ,  accelerations:  Abscent,  decelerations:  Present late decelerations UC:   regular, every 2 minutes SVE:   Dilation: 5 Effacement (%): 90 Station: -1 Exam by:: Lindsay [name]  Labs: Lab Results  Component Value Date   WBC 23.5* 12/08/2014   HGB 12.4 12/08/2014   HCT 37.6 12/08/2014   MCV 94.0 12/08/2014   PLT 321 12/08/2014    Assessment / Plan: Turned pitocin down to half, changed maternal position and gave her another bolus of IV fluids.  Discussed with patient that throughout the day, the baby has had these runs of late decelerations when  We try to augment her labor by going up on the pitocin.   I recommended that we consider proceeding with a primary c-section.  FHR is now reassuring with the pitocin turned down, so will re-check patient in another hour to see if there is  Change in dilation.  Discussed with patient risks of procedure to include: hemorrhage, infection, injury to organs.   Lindsay [name], Lindsay [name] 12/08/2014, 5:42 PM

## 2014-12-08 NOTE — Anesthesia Preprocedure Evaluation (Signed)
Anesthesia Evaluation  Patient identified by MRN, date of birth, ID band Patient awake    Reviewed: Allergy & Precautions, NPO status , Patient's Chart, lab work & pertinent test results  Airway Mallampati: II  TM Distance: >3 FB Neck ROM: Full    Dental no notable dental hx. (+) Teeth Intact   Pulmonary neg pulmonary ROS,    Pulmonary exam normal breath sounds clear to auscultation       Cardiovascular hypertension,  Rhythm:Regular Rate:Normal  Gestational HTN   Neuro/Psych Anxiety negative neurological ROS     GI/Hepatic Neg liver ROS, GERD  ,  Endo/Other  negative endocrine ROS  Renal/GU negative Renal ROS  negative genitourinary   Musculoskeletal negative musculoskeletal ROS (+)   Abdominal   Peds  Hematology negative hematology ROS (+)   Anesthesia Other Findings   Reproductive/Obstetrics (+) Pregnancy 37 weeks                             Anesthesia Physical Anesthesia Plan  ASA: II  Anesthesia Plan: Epidural   Post-op Pain Management:    Induction:   Airway Management Planned: Natural Airway  Additional Equipment:   Intra-op Plan:   Post-operative Plan:   Informed Consent: I have reviewed the patients History and Physical, chart, labs and discussed the procedure including the risks, benefits and alternatives for the proposed anesthesia with the patient or authorized representative who has indicated his/her understanding and acceptance.     Plan Discussed with: Anesthesiologist  Anesthesia Plan Comments:         Anesthesia Quick Evaluation

## 2014-12-08 NOTE — Anesthesia Procedure Notes (Signed)
Epidural Patient location during procedure: OB Start time: 12/08/2014 5:15 AM  Staffing Anesthesiologist: Mal AmabileFOSTER, Dante Roudebush Performed by: anesthesiologist   Preanesthetic Checklist Completed: patient identified, site marked, surgical consent, pre-op evaluation, timeout performed, IV checked, risks and benefits discussed and monitors and equipment checked  Epidural Patient position: sitting Prep: site prepped and draped and DuraPrep Patient monitoring: continuous pulse ox and blood pressure Approach: midline Location: L3-L4 Injection technique: LOR air  Needle:  Needle type: Tuohy  Needle gauge: 17 G Needle length: 9 cm and 9 Needle insertion depth: 4 cm Catheter type: closed end flexible Catheter size: 19 Gauge Catheter at skin depth: 9 cm Test dose: negative and Other  Assessment Events: blood not aspirated, injection not painful, no injection resistance, negative IV test and no paresthesia  Additional Notes Patient identified. Risks and benefits discussed including failed block, incomplete  Pain control, post dural puncture headache, nerve damage, paralysis, blood pressure Changes, nausea, vomiting, reactions to medications-both toxic and allergic and post Partum back pain. All questions were answered. Patient expressed understanding and wished to proceed. Sterile technique was used throughout procedure. Epidural site was Dressed with sterile barrier dressing. No paresthesias, signs of intravascular injection Or signs of intrathecal spread were encountered.  Patient was more comfortable after the epidural was dosed. Please see RN's note for documentation of vital signs and FHR which are stable.

## 2014-12-08 NOTE — Progress Notes (Signed)
Lindsay MuttersMeredith [name] is a 39 y.o. G1P0 at 140w1d by LMP admitted for induction of labor due to Hypertension.  Subjective: Patient comfortable  Objective: BP 149/98 mmHg  Pulse 121  Temp(Src) 100.2 F (37.9 C) (Oral)  Resp 20  Ht 5\' 7"  (1.702 m)  Wt 178 lb (80.74 kg)  BMI 27.87 kg/m2  SpO2 98%  LMP 03/23/2014   Total I/O In: -  Out: 2000 [Urine:2000]  FHT:  FHR: 145 bpm, variability: moderate,  accelerations:  Present,  decelerations:  Absent UC:   regular, every 2 minutes SVE:   Dilation: 5 Effacement (%): 90 Station: -1 Exam by:: Foye ClockS. Oklesh RN  Labs: Lab Results  Component Value Date   WBC 23.5* 12/08/2014   HGB 12.4 12/08/2014   HCT 37.6 12/08/2014   MCV 94.0 12/08/2014   PLT 321 12/08/2014    Assessment / Plan: Induction of labor due to gestational hypertension,  progressing well on pitocin  Labor: Progressing on Pitocin, will continue to increase Preeclampsia:  no signs or symptoms of toxicity and intake and ouput balanced Fetal Wellbeing:  Category I Pain Control:  Epidural I/D:  n/a Anticipated MOD:  NSVD  Marilou Barnfield [name] 12/08/2014, 4:23 PM

## 2014-12-08 NOTE — Progress Notes (Addendum)
Call[facility] Dr. Mora ApplPinn [facility] and requested her to review strip. Instructed to turn off pitocin and restart at half in 1 hour if FHR tracing is Cat. 1

## 2014-12-08 NOTE — Progress Notes (Signed)
Lindsay [name] is a 39 y.o. G1P0 at 3725w1d by LMP admitted for induction of labor due to Hypertension.  Subjective: Patient anxious, comfortable with epidural  Objective: BP 122/57 mmHg  Pulse 108  Temp(Src) 100.8 F (38.2 C) (Oral)  Resp 22  Ht 5\' 7"  (1.702 m)  Wt 80.74 kg (178 lb)  BMI 27.87 kg/m2  SpO2 98%  LMP 03/23/2014   Total I/O In: -  Out: 800 [Urine:800]  FHT:  FHR: 150 bpm, variability: moderate,  accelerations:  Present,  decelerations:  Present intermittent variable decel after contraction UC:   regular, every 3 minutes SVE:   Dilation: 5 Effacement (%): 90 Station: -1 Exam by:: Lindsay ClockS. Oklesh RN  Labs: Lab Results  Component Value Date   WBC 23.5* 12/08/2014   HGB 12.4 12/08/2014   HCT 37.6 12/08/2014   MCV 94.0 12/08/2014   PLT 321 12/08/2014    Assessment / Plan: Induction of labor due to gestational hypertension,  progressing well on pitocin  Labor: Progressing normally and patient SROM'd Preeclampsia:  no signs or symptoms of toxicity, intake and ouput balanced and BPs labile Fetal Wellbeing:  Category II  Several episodes of late decels, will rest fetus by turning off pitocin for one hour then start back at 1/2 dose4 Pain Control:  Epidural I/D:  n/a Anticipated MOD:  uncertain d/t fetal status  Lindsay [name] Lindsay [name] 12/08/2014, 11:35 AM

## 2014-12-09 ENCOUNTER — Encounter (HOSPITAL_COMMUNITY): Payer: Self-pay

## 2014-12-09 MED ORDER — ONDANSETRON HCL 4 MG/2ML IJ SOLN: 4.0000 mg | INTRAMUSCULAR | Status: DC | PRN

## 2014-12-09 MED ORDER — ONDANSETRON HCL 4 MG PO TABS: 4.0000 mg | ORAL_TABLET | ORAL | Status: DC | PRN

## 2014-12-09 MED ORDER — SIMETHICONE 80 MG PO CHEW
80.0000 mg | CHEWABLE_TABLET | ORAL | Status: DC | PRN
  Administered 2014-12-10: 80 mg via ORAL
  Filled 2014-12-09: qty 1

## 2014-12-09 MED ORDER — OXYCODONE-ACETAMINOPHEN 5-325 MG PO TABS
1.0000 | ORAL_TABLET | ORAL | Status: DC | PRN
  Administered 2014-12-09 – 2014-12-11 (×4): 1 via ORAL
  Filled 2014-12-09 (×4): qty 1

## 2014-12-09 MED ORDER — WITCH HAZEL-GLYCERIN EX PADS: 1.0000 "application " | MEDICATED_PAD | CUTANEOUS | Status: DC | PRN

## 2014-12-09 MED ORDER — ACETAMINOPHEN 325 MG PO TABS: 650.0000 mg | ORAL_TABLET | ORAL | Status: DC | PRN

## 2014-12-09 MED ORDER — PRENATAL MULTIVITAMIN CH
1.0000 | ORAL_TABLET | Freq: Every day | ORAL | Status: DC
  Administered 2014-12-09: 1 via ORAL
  Filled 2014-12-09 (×2): qty 1

## 2014-12-09 MED ORDER — ZOLPIDEM TARTRATE 5 MG PO TABS: 5.0000 mg | ORAL_TABLET | Freq: Every evening | ORAL | Status: DC | PRN

## 2014-12-09 MED ORDER — LANOLIN HYDROUS EX OINT: TOPICAL_OINTMENT | CUTANEOUS | Status: DC | PRN

## 2014-12-09 MED ORDER — SENNOSIDES-DOCUSATE SODIUM 8.6-50 MG PO TABS
2.0000 | ORAL_TABLET | ORAL | Status: DC
  Administered 2014-12-11: 2 via ORAL
  Filled 2014-12-09 (×2): qty 2

## 2014-12-09 MED ORDER — TETANUS-DIPHTH-ACELL PERTUSSIS 5-2.5-18.5 LF-MCG/0.5 IM SUSP: 0.5000 mL | Freq: Once | INTRAMUSCULAR | Status: DC

## 2014-12-09 MED ORDER — DIPHENHYDRAMINE HCL 25 MG PO CAPS: 25.0000 mg | ORAL_CAPSULE | Freq: Four times a day (QID) | ORAL | Status: DC | PRN

## 2014-12-09 MED ORDER — BENZOCAINE-MENTHOL 20-0.5 % EX AERO
1.0000 "application " | INHALATION_SPRAY | CUTANEOUS | Status: DC | PRN
  Administered 2014-12-09: 1 via TOPICAL
  Filled 2014-12-09: qty 56

## 2014-12-09 MED ORDER — IBUPROFEN 600 MG PO TABS
600.0000 mg | ORAL_TABLET | Freq: Four times a day (QID) | ORAL | Status: DC
  Administered 2014-12-09 – 2014-12-11 (×8): 600 mg via ORAL
  Filled 2014-12-09 (×8): qty 1

## 2014-12-09 MED ORDER — DIBUCAINE 1 % RE OINT: 1.0000 "application " | TOPICAL_OINTMENT | RECTAL | Status: DC | PRN

## 2014-12-09 MED ORDER — OXYCODONE-ACETAMINOPHEN 5-325 MG PO TABS: 2.0000 | ORAL_TABLET | ORAL | Status: DC | PRN

## 2014-12-09 NOTE — Lactation Note (Signed)
This note was copied from the chart of Lindsay Channing MuttersMeredith Sow. Lactation Consultation Note Follow up visit with mom at 12 hours of age. MBU RN request visit to review medication mom is taking.  According to  .Herma Ardhomas Hales book Medications and Mother's milk 2014, Remeron is listed as an L3 with limited data and probably compatible.  Mom reports this is the only antidepressants that works well for her and OB reviewed risks and benefits during pregnancy.  Mom was taking this med during pregnancy.  MBU RN to call Dr. To get orders for mom to continue med.        Maternal Data    Feeding Feeding Type: Breast Fed  LATCH Score/Interventions                      Lactation Tools Discussed/Used     Consult Status      Shoptaw, Arvella MerlesJana Lynn 12/09/2014, 7:01 PM

## 2014-12-09 NOTE — Lactation Note (Signed)
This note was copied from the chart of Lindsay [name] [place]. Lactation Consultation Note  Mom and baby are very tired after long induction and delivery.  Baby is skin to skin on mom's chest.  Small amount of colostrum hand expressed and spoon fed.  Double electric breast pump to be set up when mom is more alert.  Follow-up later.  Information on support groups and outpatient services given to mom.  Patient Name: Lindsay [name] Sciandra ZOXWR'UToday's Date: 12/09/2014 Reason for consult: Initial assessment;Other (Comment) (palsy)   Maternal Data Has patient been taught Hand Expression?: No (LC hand expressed for her. mom is too tired) Does the patient have breastfeeding experience prior to this delivery?: No  Feeding Feeding Type: Breast Milk  LATCH Score/Interventions Latch: Too sleepy or reluctant, no latch achieved, no sucking elicited.  Audible Swallowing: None  Type of Nipple: Flat  Comfort (Breast/Nipple): Soft / non-tender     Hold (Positioning): Full assist, staff holds infant at breast  LATCH Score: 3  Lactation Tools Discussed/Used Initiated by:: LC, Date initiated:: 12/09/14   Consult Status Consult Status: Follow-up Date: 12/09/14 Follow-up type: In-patient    Soyla DryerJoseph, Desmund Elman 12/09/2014, 10:05 AM

## 2014-12-09 NOTE — Lactation Note (Signed)
This note was copied from the chart of Lindsay Channing MuttersMeredith Zarcone. Lactation Consultation Note RN successfully latched baby to the left breast which  reportedly has an inverted nipple.  His left jaw was observed rhythmically suckling.  Mom reported that his "paralysis" is improving.  Recommended mom  Pump both breasts after every other BF.  Patient Name: Lindsay [name] ZOXWR'UToday's Date: 12/09/2014     Maternal Data    Feeding Feeding Type: Breast Fed Length of feed: 5 min  LATCH Score/Interventions Latch: Grasps breast easily, tongue down, lips flanged, rhythmical sucking.  Audible Swallowing: A few with stimulation Intervention(s): Skin to skin  Type of Nipple: Flat Intervention(s): Double electric pump  Comfort (Breast/Nipple): Soft / non-tender     Hold (Positioning): No assistance needed to correctly position infant at breast.  LATCH Score: 8  Lactation Tools Discussed/Used     Consult Status      Lindsay [name], Lindsay [name] 12/09/2014, 2:27 PM

## 2014-12-09 NOTE — Progress Notes (Signed)
Pt has history of depression and PTSD.  Consider starting antidepressant before leaving hospital.

## 2014-12-09 NOTE — Anesthesia Postprocedure Evaluation (Signed)
  Anesthesia Post-op Note  Patient: Lindsay [name]  Procedure(s) Performed: * No procedures listed *  Patient Location: Mother/Baby  Anesthesia Type:Epidural  Level of Consciousness: awake, alert , oriented and patient cooperative  Airway and Oxygen Therapy: Patient Spontanous Breathing  Post-op Pain: none  Post-op Assessment: Post-op Vital signs reviewed, Patient's Cardiovascular Status Stable, Respiratory Function Stable, Patent Airway, No headache, No backache and Patient able to bend at knees              Post-op Vital Signs: Reviewed and stable  Last Vitals:  Filed Vitals:   12/09/14 1222  BP: 145/85  Pulse: 99  Temp:   Resp:     Complications: No apparent anesthesia complications

## 2014-12-10 LAB — CBC
HEMATOCRIT: 32.8 % — AB (ref 36.0–46.0)
HEMOGLOBIN: 10.8 g/dL — AB (ref 12.0–15.0)
MCH: 31.4 pg (ref 26.0–34.0)
MCHC: 32.9 g/dL (ref 30.0–36.0)
MCV: 95.3 fL (ref 78.0–100.0)
Platelets: 331 10*3/uL (ref 150–400)
RBC: 3.44 MIL/uL — ABNORMAL LOW (ref 3.87–5.11)
RDW: 14.3 % (ref 11.5–15.5)
WBC: 19 10*3/uL — AB (ref 4.0–10.5)

## 2014-12-10 MED ORDER — MIRTAZAPINE 45 MG PO TABS
45.0000 mg | ORAL_TABLET | Freq: Every day | ORAL | Status: DC
  Administered 2014-12-10: 45 mg via ORAL
  Filled 2014-12-10: qty 1

## 2014-12-10 NOTE — Lactation Note (Signed)
This note was copied from the chart of Lindsay Channing MuttersMeredith Mastro. Lactation Consultation Note   Baby crying.  Offered assistance w/ latching or comforting baby.  Mother states "no" we are fine.   Mother states baby breastfed at 1100 for 35 min.  States she does not need assistance at this time. Encouraged her to call if we can help.    Patient Name: Lindsay [name] [id number]'s Date: 12/10/2014 Reason for consult: Follow-up assessment   Maternal Data    Feeding Feeding Type: Breast Fed Length of feed: 35 min  LATCH Score/Interventions                      Lactation Tools Discussed/Used     Consult Status Consult Status: Complete    Hardie PulleyBerkelhammer, Nadira Single Boschen 12/10/2014, 1:26 PM

## 2014-12-10 NOTE — Lactation Note (Signed)
This note was copied from the chart of Lindsay Channing MuttersMeredith [name]. Lactation Consultation Note  Patient Name: Lindsay Channing MuttersMeredith [name] ZOXWR'UToday's Date: 12/10/2014 Reason for consult: Follow-up assessment Mom requested lactation because baby was not latching. Baby did finally latch to the R breast for about 5 minutes, a few swallows were heard then baby fell asleep. Baby was placed sts and photo therapy was reapplied. Mom will page as needed for bf help.    Maternal Data Has patient been taught Hand Expression?: Yes  Feeding Feeding Type: Breast Fed Length of feed: 5 min  LATCH Score/Interventions Latch: Too sleepy or reluctant, no latch achieved, no sucking elicited. Intervention(s): Skin to skin  Audible Swallowing: None Intervention(s): Skin to skin Intervention(s): Hand expression  Type of Nipple: Flat Intervention(s): No intervention needed  Comfort (Breast/Nipple): Soft / non-tender     Hold (Positioning): Assistance needed to correctly position infant at breast and maintain latch. Intervention(s): Support Pillows;Position options  LATCH Score: 4  Lactation Tools Discussed/Used     Consult Status Consult Status: Follow-up Date: 12/11/14 Follow-up type: In-patient    Rulon Eisenmengerlizabeth E Patrese Neal 12/10/2014, 9:19 PM

## 2014-12-10 NOTE — Progress Notes (Signed)
Patient is doing well.  She is ambulating, voiding, tolerating PO.  Pain control is good.  Lochia is appropriate  Filed Vitals:   12/09/14 1222 12/09/14 1300 12/09/14 1829 12/10/14 0554  BP: 145/85 142/91 143/90 124/67  Pulse: 99 95 93 58  Temp:  98.2 F (36.8 C) 98 F (36.7 C) 98 F (36.7 C)  TempSrc:  Oral Oral Oral  Resp:  18 18 18   Height:      Weight:      SpO2:        NAD Fundus firm Ext: 2+ edema, symmetric  Lab Results  Component Value Date   WBC 19.0* 12/10/2014   HGB 10.8* 12/10/2014   HCT 32.8* 12/10/2014   MCV 95.3 12/10/2014   PLT 331 12/10/2014    A/Positive/-- (10/10 2127)/RImmune  A/P 39 y.o. G1P1001 PPD#1 s/p TSVD for GHTN BPs stable Anxiety/PTSD--cont remeron.  Has appt schedule w psychiatrist in early Nov for f/u. Routine care.   Expect d/c tomorrow.  Circ prior to d/c.    [facility] Psychiatric CenterDYANNA GEFFEL [employer] CompanyCLARK

## 2014-12-10 NOTE — Progress Notes (Signed)
CLINICAL SOCIAL WORK MATERNAL/CHILD NOTE  Patient Details  Name: Lindsay [name] MRN: [id number] Date of Birth: 12/09/2014  Date:  12/10/2014  Clinical Social Worker Initiating Note:  Sarah Venning, LCSW and Johaan Ryser, BSW, MSW intern   Date/ Time Initiated:  12/10/14/1200     Child's Name:  Lindsay [name]    Legal Guardian:  Kasumi Pierre (MOB) and Coldan Kleiner (FOB)    Need for Interpreter:  None   Date of Referral:  12/09/14     Reason for Referral:  Behavioral Health Issues, including SI    Referral Source:  Central Nursery   Address:  [address] [place], [place] [address]   Phone number:  [phone]   Household Members:  Self, Spouse   Natural Supports (not living in the home):  Extended Family, Immediate Family   Professional Supports: Psychiatrist    Employment: Full-time   Type of Work: Car Dealership    Education:  High school graduate   Financial Resources:  Private Insurance   Other Resources:      Cultural/Religious Considerations Which May Impact Care:    Strengths:  Ability to meet basic needs , Home prepared for child , Compliance with medical plan    Risk Factors/Current Problems:  Mental Health Concerns- MOB presents with a history of anxiety and PTSD. Per chart review, she is prescribed Remeron. MOB reported she has a psychiatrist at [facility] that prescribes her medication and also meets with her for an hour each appointment. MOB stated her follow up appointment is on November 9,2016.    Cognitive State:  Linear Thinking , Able to Concentrate , Insightful , Goal Oriented    Mood/Affect:  Relaxed , Comfortable , Happy , Interested    CSW Assessment:  MSW intern and CSW presented in patient's room due to a consult being placed because of a history of anxiety and PTSD. FOB and MOB's mother were present in the room. MOB provided verbal consent for MSW intern and CSW to engage. MOB presented to be in a happy mood  as evidence by her constant smiling,and active engagement during the assessment.  Per MOB her birthing process was long and painful but she voiced she was transitioning well into postpartum and glad that it was all over with. MOB expressed feelings of anxiety during the birthing process because she was not sure if she would have a vaginal delivery or a c-section. MOB also stated feeling anxious about not being able to hold her infant right away and not hearing his first cry. However, MOB shared it all turned out fine and the birth of her infant was celebrated by all her nursing staff and family. MOB and FOB expressed being very happy with the medical care they had been provided. MOB shared she is breastfeeding and did not voice any concerns in that regard. Per MOB, she is prepared to take the infant home and has met all of the infants basic needs. MOB shared having a great support system from her spouse, mothers, and family members.   MSW intern inquired about MOB's prior mental health history. MOB endorsed she suffers from anxiety and PTSD. MOB also stated she has prescribed medication that she took during her pregnancy and was planning to continue to take. Per MOB, she has not been prescribed the medication at the hospital and requested for CSW to inquire about getting her medications while at the hospital. CSW to contact Dr. Clark per patient's request. MOB informed MSW intern she   has a follow up appointment with her psychiatrist on 11/9. MOB stated her psychiatrist and her had already discussed perinatal mood disorders and her high risk or experiencing symptoms because of her mental health history.MOB expressed feelings of anxiety during the pregnancy because it was a painful pregnancy and was on bed rest the last two weeks. MOB also shared she felt anxious about taking the infant home because she was a first time mother. However, CSW reassured MOB by providing motivational interviewing.  MOB displayed great  self- talk skills and coping skills in regard to her anxiety. MOB shared feeling knowledgeable about the topic and had a plan in place already. MOB voiced having a great support system and feeling prepared to talk about her feelings to them if needs arise. MSW intern provided MOB with information on the hospital's support group, "Feelings After Birth," and a postpartum mental health checklist.    MOB was appreciative of the information provided and denied having any further questions. MOB agreed to contact CSW if needs arise.    CSW Plan/Description:   Engineer, mining- MSW intern provided education on perinatal mood disorders and "Feelings After Brith."  No Further Intervention Required/No Barriers to Discharge    Trevor Iha, Student-SW 12/10/2014, 1:43 PM

## 2014-12-11 ENCOUNTER — Ambulatory Visit: Payer: Self-pay

## 2014-12-11 MED ORDER — OXYCODONE-ACETAMINOPHEN 5-325 MG PO TABS: 1.0000 | ORAL_TABLET | Freq: Four times a day (QID) | ORAL | Status: DC | PRN

## 2014-12-11 NOTE — Progress Notes (Signed)
Patient is doing well.  She is ambulating, voiding, tolerating PO.  Pain control is good.  Lochia is appropriate  Filed Vitals:   12/09/14 1829 12/10/14 0554 12/10/14 1808 12/11/14 0500  BP: 143/90 124/67 141/83 128/67  Pulse: 93 58 79 77  Temp: 98 F (36.7 C) 98 F (36.7 C) 98.3 F (36.8 C) 98.9 F (37.2 C)  TempSrc: Oral Oral Oral Axillary  Resp: 18 18 18 19   Height:      Weight:      SpO2:        NAD Fundus firm Ext: 2+ edema, symmetric  Lab Results  Component Value Date   WBC 19.0* 12/10/2014   HGB 10.8* 12/10/2014   HCT 32.8* 12/10/2014   MCV 95.3 12/10/2014   PLT 331 12/10/2014    A/Positive/-- (10/10 2127)/RImmune  A/P [age] y.o. G1P1001 PPD#2 s/p TSVD for GHTN BPs stable Anxiety/PTSD--cont remeron.  Has appt schedule w psychiatrist in early Nov for f/u. Routine care.   Baby currently under bili lights--desires circ, may need to defer until tomorrow.  Desires circumcisison. Discussed r/b/a of the procedure. Reviewed that circumcision is an elective surgical procedure and not considered medically necessary. Reviewed the risks of the procedure including the risk of infection, bleeding, damage to surrounding structures, including scrotum, shaft, urethra and head of penis, and an undesired cosmetic effect requiring additional procedures for revision. Consent signed.     [facility] HeightsDYANNA [name] [employer]CLARK

## 2014-12-11 NOTE — Progress Notes (Signed)
Baby staying under bili lights today.  Will defer circ, ok to d/c mom, will room in w baby

## 2014-12-11 NOTE — Discharge Summary (Addendum)
Obstetric Discharge Summary Reason for Admission: induction of labor Prenatal Procedures: none Intrapartum Procedures: spontaneous vaginal delivery Postpartum Procedures: none Complications-Operative and Postpartum: labial and periurethral laceration HEMOGLOBIN  Date Value Ref Range Status  12/10/2014 10.8* 12.0 - 15.0 g/dL Final  95/62/130808/19/2016 65.712.7 g/dL Final   HCT  Date Value Ref Range Status  12/10/2014 32.8* 36.0 - 46.0 % Final  10/15/2014 37 % Final    Physical Exam:  General: alert, cooperative and appears stated age Lochia: appropriate Uterine Fundus: firm DVT Evaluation: No evidence of DVT seen on physical exam.  Discharge Diagnoses: Term Pregnancy-delivered  Discharge Information: Date: 12/11/2014 Activity: pelvic rest Diet: routine Medications: PNV, Ibuprofen, Colace and Percocet Condition: stable Instructions: refer to practice specific booklet Discharge to: home Follow-up Information    Follow up with CALLAHAN, SIDNEY, DO In 2 weeks.   Specialty:  Obstetrics and Gynecology   Why:  10-14 days for BP check and mood check PP   Contact information:   [address] [place] [address] [phone]       Newborn Data: Live born female  Birth Weight: 6 lb 14.1 oz (3120 g) APGAR: 3, 5  Home with mother.  Mili Piltz GEFFEL Hady Niemczyk 12/11/2014, 8:53 AM

## 2014-12-11 NOTE — Lactation Note (Signed)
This note was copied from the chart of Lindsay Channing MuttersMeredith Consiglio. Lactation Consultation Note: Infant is under double photo tx. Mother has been bottle feeding7-15 ml .  Infant is lying in crib crying with family member soothing infant. Mother is having her lunch. She immediately declined LC assistance. When ask if she is aware of treatment to prevent engorgement she states she understands. Mother giving poor eye contact. She was ask if she wanted LC to come back. She state no she is good . Mother will be seen again if she request assistance.   Patient Name: Lindsay [name] ZOXWR'UToday's Date: 12/11/2014 Reason for consult: Follow-up assessment   Maternal Data    Feeding Feeding Type: Bottle Fed - Formula Nipple Type: Slow - flow  LATCH Score/Interventions                      Lactation Tools Discussed/Used     Consult Status Consult Status: Complete (mother wants to be seen only if she request)    Michel BickersKendrick, Sherhonda Gaspar McCoy 12/11/2014, 5:05 PM

## 2019-03-02 ENCOUNTER — Other Ambulatory Visit: Payer: Self-pay

## 2019-03-02 ENCOUNTER — Ambulatory Visit: Payer: 59 | Attending: Internal Medicine

## 2019-03-02 DIAGNOSIS — Z20822 Contact with and (suspected) exposure to covid-19: Secondary | ICD-10-CM

## 2019-03-03 LAB — NOVEL CORONAVIRUS, NAA: SARS-CoV-2, NAA: NOT DETECTED

## 2019-06-05 ENCOUNTER — Ambulatory Visit: Payer: 59

## 2019-11-11 ENCOUNTER — Ambulatory Visit
Admission: EM | Admit: 2019-11-11 | Discharge: 2019-11-11 | Disposition: A | Discharge status: Home / Self Care | Payer: BC Managed Care – PPO | Attending: Emergency Medicine | Admitting: Emergency Medicine

## 2019-11-11 ENCOUNTER — Other Ambulatory Visit: Payer: Self-pay

## 2019-11-11 DIAGNOSIS — J029 Acute pharyngitis, unspecified: Secondary | ICD-10-CM

## 2019-11-11 DIAGNOSIS — Z1152 Encounter for screening for COVID-19: Secondary | ICD-10-CM

## 2019-11-11 DIAGNOSIS — J069 Acute upper respiratory infection, unspecified: Secondary | ICD-10-CM | POA: Insufficient documentation

## 2019-11-11 LAB — POCT RAPID STREP A (OFFICE): Rapid Strep A Screen: NEGATIVE

## 2019-11-11 MED ORDER — FLUTICASONE PROPIONATE 50 MCG/ACT NA SUSP: 1.0000 | Freq: Every day | NASAL | 0 refills | Status: AC

## 2019-11-11 MED ORDER — CETIRIZINE HCL 10 MG PO TABS: 10.0000 mg | ORAL_TABLET | Freq: Every day | ORAL | 0 refills | Status: AC

## 2019-11-11 MED ORDER — DEXAMETHASONE 4 MG PO TABS: 4.0000 mg | ORAL_TABLET | Freq: Every day | ORAL | 0 refills | Status: AC

## 2019-11-11 MED ORDER — BENZONATATE 100 MG PO CAPS: 100.0000 mg | ORAL_CAPSULE | Freq: Three times a day (TID) | ORAL | 0 refills | Status: DC

## 2019-11-11 NOTE — ED Provider Notes (Signed)
[facility]   [id number] 11/11/19 Arrival Time: 1444   CC: COVID symptoms  SUBJECTIVE: History from: patient and family.  Lindsay [name] is a 44 y.o. female who presented to the urgent care for complaint of nasal congestion, sore throat, cough and nasal congestion for the past few days.  Reports she has a rapid Covid test and it was negative..  Denies sick exposure to COVID, flu or strep.  Denies recent travel.  Has no tried any OTC medication.  Denies aggravating factors*.  Reports/ denies previous symptoms in the past.   Denies fever, chills, fatigue, sinus pain, rhinorrhea, sore throat, SOB, wheezing, chest pain, nausea, changes in bowel or bladder habits.     ROS: As per HPI.  All other pertinent ROS negative.     Past Medical History:  Diagnosis Date  . Anxiety    Past Surgical History:  Procedure Laterality Date  . DENTAL EXAMINATION UNDER ANESTHESIA     Allergies  Allergen Reactions  . Labetalol Other (See Comments)    "it was like I had been drinking", hypotension, slurring words, lightheaded   No current facility-administered medications on file prior to encounter.   Current Outpatient Medications on File Prior to Encounter  Medication Sig Dispense Refill  . Calcium Carbonate-Simethicone (ALKA-SELTZER HEARTBURN + GAS) 750-80 MG CHEW Chew 1 each by mouth daily as needed (heartburn).    . mirtazapine (REMERON) 45 MG tablet Take 45 mg by mouth at bedtime.    Marland Kitchen oxyCODONE-acetaminophen (PERCOCET/ROXICET) 5-325 MG tablet Take 1 tablet by mouth every 6 (six) hours as needed for severe pain. 20 tablet 0  . Prenatal Vit-Fe Fumarate-FA (MULTIVITAMIN-PRENATAL) 27-0.8 MG TABS tablet Take 1 tablet by mouth daily at 12 noon.     Social History   Socioeconomic History  . Marital status: Divorced    Spouse name: Not on file  . Number of children: Not on file  . Years of education: Not on file  . Highest education level: Not on file  Occupational History  . Not on  file  Tobacco Use  . Smoking status: Never Smoker  . Smokeless tobacco: Never Used  Substance and Sexual Activity  . Alcohol use: No  . Drug use: No  . Sexual activity: Not Currently  Other Topics Concern  . Not on file  Social History Narrative  . Not on file   Social Determinants of Health   Financial Resource Strain:   . Difficulty of Paying Living Expenses: Not on file  Food Insecurity:   . Worried About Programme researcher, broadcasting/film/video in the Last Year: Not on file  . Ran Out of Food in the Last Year: Not on file  Transportation Needs:   . Lack of Transportation (Medical): Not on file  . Lack of Transportation (Non-Medical): Not on file  Physical Activity:   . Days of Exercise per Week: Not on file  . Minutes of Exercise per Session: Not on file  Stress:   . Feeling of Stress : Not on file  Social Connections:   . Frequency of Communication with Friends and Family: Not on file  . Frequency of Social Gatherings with Friends and Family: Not on file  . Attends Religious Services: Not on file  . Active Member of Clubs or Organizations: Not on file  . Attends Banker Meetings: Not on file  . Marital Status: Not on file  Intimate Partner Violence:   . Fear of Current or Ex-Partner: Not on file  .  Emotionally Abused: Not on file  . Physically Abused: Not on file  . Sexually Abused: Not on file   No family history on file.  OBJECTIVE:  Vitals:   11/11/19 1554  BP: 116/83  Pulse: 95  Resp: 20  Temp: 99.9 F (37.7 C)  SpO2: 96%     General appearance: alert; appears fatigued, but nontoxic; speaking in full sentences and tolerating own secretions HEENT: NCAT; Ears: EACs clear, TMs pearly gray; Eyes: PERRL.  EOM grossly intact. Sinuses: nontender; Nose: nares patent without rhinorrhea, Throat: oropharynx clear, tonsils non erythematous or enlarged, uvula midline  Neck: supple without LAD Lungs: unlabored respirations, symmetrical air entry; cough: mild; no  respiratory distress; CTAB Heart: regular rate and rhythm.  Radial pulses 2+ symmetrical bilaterally Skin: warm and dry Psychological: alert and cooperative; normal mood and affect  LABS:  Results for orders placed or performed during the hospital encounter of 11/11/19 (from the past 24 hour(s))  POCT rapid strep A     Status: None   Collection Time: 11/11/19  4:07 PM  Result Value Ref Range   Rapid Strep A Screen Negative Negative     ASSESSMENT & PLAN:  1. Viral URI   2. Encounter for screening for COVID-19   3. Sore throat     Meds ordered this encounter  Medications  . benzonatate (TESSALON) 100 MG capsule    Sig: Take 1 capsule (100 mg total) by mouth every 8 (eight) hours.    Dispense:  30 capsule    Refill:  0  . fluticasone (FLONASE) 50 MCG/ACT nasal spray    Sig: Place 1 spray into both nostrils daily for 14 days.    Dispense:  16 g    Refill:  0  . cetirizine (ZYRTEC ALLERGY) 10 MG tablet    Sig: Take 1 tablet (10 mg total) by mouth daily.    Dispense:  30 tablet    Refill:  0  . dexamethasone (DECADRON) 4 MG tablet    Sig: Take 1 tablet (4 mg total) by mouth daily for 7 days.    Dispense:  7 tablet    Refill:  0    Discharge instructions.    COVID testing ordered.  It will take between 2-7 days for test results.  Someone will contact you regarding abnormal results.    In the meantime: You should remain isolated in your home for 10 days from symptom onset AND greater than 24 hours after symptoms resolution (absence of fever without the use of fever-reducing medication and improvement in respiratory symptoms), whichever is longer Get plenty of rest and push fluids Tessalon Perles prescribed for cough Zyrtec for nasal congestion, runny nose, and/or sore throat Flonase for nasal congestion and runny nose Decadron was prescribed Use medications daily for symptom relief Use OTC medications like ibuprofen or tylenol as needed fever or pain Call or go to the  ED if you have any new or worsening symptoms such as fever, worsening cough, shortness of breath, chest tightness, chest pain, turning blue, changes in mental status, etc...   Reviewed expectations re: course of current medical issues. Questions answered. Outlined signs and symptoms indicating need for more acute intervention. Patient verbalized understanding. After Visit Summary given.      Note: This document was prepared using Dragon voice recognition software and may include unintentional dictation errors.    Durward Parcel, FNP 11/11/19 1637

## 2019-11-11 NOTE — Discharge Instructions (Addendum)

## 2019-11-11 NOTE — ED Triage Notes (Signed)
Pt presents with c/o sore throat, hoarseness and fever , took rapid covid today at [facility] and was negative

## 2019-11-13 LAB — SARS-COV-2, NAA 2 DAY TAT

## 2019-11-13 LAB — NOVEL CORONAVIRUS, NAA: SARS-CoV-2, NAA: NOT DETECTED

## 2019-11-14 LAB — CULTURE, GROUP A STREP (THRC)

## 2020-04-21 ENCOUNTER — Ambulatory Visit: Payer: Self-pay

## 2020-06-27 ENCOUNTER — Ambulatory Visit: Payer: Self-pay

## 2020-11-12 ENCOUNTER — Encounter: Payer: Self-pay | Admitting: Emergency Medicine

## 2020-11-12 ENCOUNTER — Other Ambulatory Visit: Payer: Self-pay

## 2020-11-12 ENCOUNTER — Ambulatory Visit
Admission: EM | Admit: 2020-11-12 | Discharge: 2020-11-12 | Disposition: A | Discharge status: Home / Self Care | Payer: BC Managed Care – PPO | Attending: Family Medicine | Admitting: Family Medicine

## 2020-11-12 DIAGNOSIS — H0289 Other specified disorders of eyelid: Secondary | ICD-10-CM

## 2020-11-12 DIAGNOSIS — H10531 Contact blepharoconjunctivitis, right eye: Secondary | ICD-10-CM | POA: Diagnosis not present

## 2020-11-12 MED ORDER — DEXAMETHASONE SODIUM PHOSPHATE 10 MG/ML IJ SOLN
10.0000 mg | Freq: Once | INTRAMUSCULAR | Status: AC
  Administered 2020-11-12: 10 mg via INTRAMUSCULAR

## 2020-11-12 MED ORDER — PREDNISONE 20 MG PO TABS: 40.0000 mg | ORAL_TABLET | Freq: Every day | ORAL | 0 refills | Status: AC

## 2020-11-12 MED ORDER — GENTAMICIN SULFATE 0.3 % OP SOLN: 2.0000 [drp] | Freq: Three times a day (TID) | OPHTHALMIC | 0 refills | Status: AC

## 2020-11-12 MED ORDER — SULFAMETHOXAZOLE-TRIMETHOPRIM 800-160 MG PO TABS: 1.0000 | ORAL_TABLET | Freq: Two times a day (BID) | ORAL | 0 refills | Status: DC

## 2020-11-12 NOTE — ED Triage Notes (Signed)
Eye drainage that started a few days ago. RT eye lid is swollen and painful today.

## 2020-11-12 NOTE — ED Provider Notes (Signed)
[facility]    CSN: [id number] Arrival date & time: 11/12/20  1414      History   Chief Complaint No chief complaint on file.   HPI Lindsay [name] is a 45 y.o. female.   HPI Patient presents today with right eye swelling and drainage which has been present x1 day however progressively worse today.  She complains of severe light sensitivity along with a burning sensation.  She has swelling of the upper and lower eyelid.  She wears artificial eyelashes however reports that those have been in place for extended period of time and is not the source of her current symptoms. She denies any other associated upper respiratory symptoms.  She endorses some pain immediately below the right eye.  She is having drainage from the eye and the orbit is red.  She had some leftover polymyxin eyedrops that she instilled in her eye that did not provide relief of current symptoms Past Medical History:  Diagnosis Date   Anxiety     Patient Active Problem List   Diagnosis Date Noted   Gestational hypertension 12/06/2014    Past Surgical History:  Procedure Laterality Date   DENTAL EXAMINATION UNDER ANESTHESIA      OB History     Gravida  1   Para  1   Term  1   Preterm      AB      Living  1      SAB      IAB      Ectopic      Multiple  0   Live Births  1            Home Medications    Prior to Admission medications   Medication Sig Start Date End Date Taking? Authorizing Provider  benzonatate (TESSALON) 100 MG capsule Take 1 capsule (100 mg total) by mouth every 8 (eight) hours. 11/11/19   Avegno, Zachery Dakins, FNP  Calcium Carbonate-Simethicone (ALKA-SELTZER HEARTBURN + GAS) 750-80 MG CHEW Chew 1 each by mouth daily as needed (heartburn).    [provider]  cetirizine (ZYRTEC ALLERGY) 10 MG tablet Take 1 tablet (10 mg total) by mouth daily. 11/11/19   Avegno, Zachery Dakins, FNP  fluticasone (FLONASE) 50 MCG/ACT nasal spray Place 1 spray into both  nostrils daily for 14 days. 11/11/19 11/25/19  Avegno, Zachery Dakins, FNP  mirtazapine (REMERON) 45 MG tablet Take 45 mg by mouth at bedtime.    [provider]  oxyCODONE-acetaminophen (PERCOCET/ROXICET) 5-325 MG tablet Take 1 tablet by mouth every 6 (six) hours as needed for severe pain. 12/11/14   Marlow Baars, MD  Prenatal Vit-Fe Fumarate-FA (MULTIVITAMIN-PRENATAL) 27-0.8 MG TABS tablet Take 1 tablet by mouth daily at 12 noon.    [provider]    Family History No family history on file.  Social History Social History   Tobacco Use   Smoking status: Never   Smokeless tobacco: Never  Substance Use Topics   Alcohol use: No   Drug use: No     Allergies   Labetalol   Review of Systems Review of Systems Pertinent negatives listed in HPI   Physical Exam Triage Vital Signs ED Triage Vitals  Enc Vitals Group     BP 11/12/20 1528 (!) 141/84     Pulse Rate 11/12/20 1528 74     Resp 11/12/20 1528 18     Temp 11/12/20 1528 98.5 F (36.9 C)     Temp Source 11/12/20 1528  Oral     SpO2 11/12/20 1528 98 %     Weight --      Height --      Head Circumference --      Peak Flow --      Pain Score 11/12/20 1531 10     Pain Loc --      Pain Edu? --      Excl. in GC? --    No data found.  Updated Vital Signs BP (!) 141/84   Pulse 74   Temp 98.5 F (36.9 C) (Oral)   Resp 18   SpO2 98%   Visual Acuity Right Eye Distance:   Left Eye Distance:   Bilateral Distance:    Right Eye Near:   Left Eye Near:    Bilateral Near:     Physical Exam Constitutional:      Appearance: Normal appearance.  Eyes:     Extraocular Movements: Extraocular movements intact.     Conjunctiva/sclera:     Right eye: Right conjunctiva is injected. Exudate and hemorrhage present.     Left eye: Left conjunctiva is not injected. No chemosis or hemorrhage.  Cardiovascular:     Rate and Rhythm: Normal rate and regular rhythm.  Pulmonary:     Effort: Pulmonary effort is  normal.     Breath sounds: Normal breath sounds.  Musculoskeletal:     Cervical back: Normal range of motion.  Neurological:     Mental Status: She is alert.     UC Treatments / Results  Labs (all labs ordered are listed, but only abnormal results are displayed) Labs Reviewed - No data to display  EKG   Radiology No results found.  Procedures Procedures (including critical care time)  Medications Ordered in UC Medications - No data to display  Initial Impression / Assessment and Plan / UC Course  I have reviewed the triage vital signs and the nursing notes.  Pertinent labs & imaging results that were available during my care of the patient were reviewed by me and considered in my medical decision making (see chart for details).    Contact blepharoconjunctivitis, Decadron 10 mg IM given here in clinic Pain and swelling upper eyelid right eye  Prednisone, Garamycin eye drops,  and Bactrim  ER precaution if symptoms worsen and do not improve Final Clinical Impressions(s) / UC Diagnoses   Final diagnoses:  Contact blepharoconjunctivitis of right eye  Pain and swelling of upper eyelid of right eye     Discharge Instructions      Start Prednisone tomorrow. Start Bactrim today Gentamicin eye drops 3 times daily for 7 days     ED Prescriptions     Medication Sig Dispense Auth. Provider   sulfamethoxazole-trimethoprim (BACTRIM DS) 800-160 MG tablet Take 1 tablet by mouth 2 (two) times daily. 20 tablet Bing Neighbors, FNP   gentamicin (GARAMYCIN) 0.3 % ophthalmic solution Place 2 drops into the right eye 3 (three) times daily for 7 days. 2.1 mL Bing Neighbors, FNP   predniSONE (DELTASONE) 20 MG tablet Take 2 tablets (40 mg total) by mouth daily with breakfast for 5 days. 10 tablet Bing Neighbors, FNP      PDMP not reviewed this encounter.   Bing Neighbors, FNP 11/13/20 1610

## 2020-11-12 NOTE — Discharge Instructions (Addendum)
Start Prednisone tomorrow. Start Bactrim today Gentamicin eye drops 3 times daily for 7 days

## 2021-01-20 ENCOUNTER — Other Ambulatory Visit: Payer: Self-pay

## 2021-01-20 ENCOUNTER — Emergency Department (HOSPITAL_COMMUNITY)
Admission: EM | Admit: 2021-01-20 | Discharge: 2021-01-20 | Disposition: A | Discharge status: Home / Self Care | Payer: BC Managed Care – PPO | Attending: Emergency Medicine | Admitting: Emergency Medicine

## 2021-01-20 ENCOUNTER — Encounter (HOSPITAL_COMMUNITY): Payer: Self-pay

## 2021-01-20 ENCOUNTER — Emergency Department (HOSPITAL_COMMUNITY): Payer: BC Managed Care – PPO

## 2021-01-20 DIAGNOSIS — Z79899 Other long term (current) drug therapy: Secondary | ICD-10-CM | POA: Insufficient documentation

## 2021-01-20 DIAGNOSIS — W19XXXA Unspecified fall, initial encounter: Secondary | ICD-10-CM | POA: Diagnosis not present

## 2021-01-20 DIAGNOSIS — S8991XA Unspecified injury of right lower leg, initial encounter: Secondary | ICD-10-CM | POA: Diagnosis present

## 2021-01-20 DIAGNOSIS — S82831A Other fracture of upper and lower end of right fibula, initial encounter for closed fracture: Secondary | ICD-10-CM | POA: Insufficient documentation

## 2021-01-20 DIAGNOSIS — W101XXA Fall (on)(from) sidewalk curb, initial encounter: Secondary | ICD-10-CM

## 2021-01-20 MED ORDER — HYDROCODONE-ACETAMINOPHEN 5-325 MG PO TABS: 1.0000 | ORAL_TABLET | Freq: Four times a day (QID) | ORAL | 0 refills | Status: DC | PRN

## 2021-01-20 MED ORDER — HYDROCODONE-ACETAMINOPHEN 5-325 MG PO TABS
1.0000 | ORAL_TABLET | Freq: Once | ORAL | Status: AC
  Administered 2021-01-20: 1 via ORAL
  Filled 2021-01-20: qty 1

## 2021-01-20 NOTE — ED Triage Notes (Signed)
Right ankle jpain after rolling ankle. Pt heard a loud pop.

## 2021-01-20 NOTE — ED Provider Notes (Signed)
[facility] EMERGENCY DEPARTMENT Provider Note   CSN: [id number] Arrival date & time: 01/20/21  1614     History Chief Complaint  Patient presents with   Lindsay [name]    Lindsay [name] is a 45 y.o. female presenting for evaluation of a right ankle injury which occurred just prior to arrival.  She was on her porch decorating for Christmas when she tripped and fell and twisted her ankle, she is unsure if this was an eversion or inversion injury but states there was a loud popping sensation which she felt through her leg when the injury occurred.  She has not tried to weight-bear prior to arrival, presents with her brothers borrowed crutches.  She has taken ibuprofen 800 mg prior to arrival.  She denies any other injury including knee foot hip, no head injury.  Denies weakness or numbness in her foot or toes.  The history is provided by the patient.      Past Medical History:  Diagnosis Date   Anxiety     Patient Active Problem List   Diagnosis Date Noted   Gestational hypertension 12/06/2014    Past Surgical History:  Procedure Laterality Date   DENTAL EXAMINATION UNDER ANESTHESIA     DENTAL SURGERY       OB History     Gravida  1   Para  1   Term  1   Preterm      AB      Living  1      SAB      IAB      Ectopic      Multiple  0   Live Births  1           History reviewed. No pertinent family history.  Social History   Tobacco Use   Smoking status: Never   Smokeless tobacco: Never  Substance Use Topics   Alcohol use: No   Drug use: No    Home Medications Prior to Admission medications   Medication Sig Start Date End Date Taking? Authorizing Provider  HYDROcodone-acetaminophen (NORCO/VICODIN) 5-325 MG tablet Take 1 tablet by mouth every 6 (six) hours as needed for severe pain. 01/20/21  Yes Christophor Eick, Raynelle Fanning, PA-C  benzonatate (TESSALON) 100 MG capsule Take 1 capsule (100 mg total) by mouth every 8 (eight) hours. 11/11/19   Avegno, Zachery Dakins, FNP   Calcium Carbonate-Simethicone (ALKA-SELTZER HEARTBURN + GAS) 750-80 MG CHEW Chew 1 each by mouth daily as needed (heartburn).    [provider]  cetirizine (ZYRTEC ALLERGY) 10 MG tablet Take 1 tablet (10 mg total) by mouth daily. 11/11/19   Avegno, Zachery Dakins, FNP  fluticasone (FLONASE) 50 MCG/ACT nasal spray Place 1 spray into both nostrils daily for 14 days. 11/11/19 11/25/19  Avegno, Zachery Dakins, FNP  mirtazapine (REMERON) 45 MG tablet Take 45 mg by mouth at bedtime.    [provider]  Prenatal Vit-Fe Fumarate-FA (MULTIVITAMIN-PRENATAL) 27-0.8 MG TABS tablet Take 1 tablet by mouth daily at 12 noon.    [provider]  sulfamethoxazole-trimethoprim (BACTRIM DS) 800-160 MG tablet Take 1 tablet by mouth 2 (two) times daily. 11/12/20   Bing Neighbors, FNP    Allergies    Labetalol  Review of Systems   Review of Systems  Constitutional:  Negative for fever.  Musculoskeletal:  Positive for arthralgias and joint swelling. Negative for myalgias.  Neurological:  Negative for weakness and numbness.  All other systems reviewed and are negative.  Physical Exam Updated Vital  Signs BP 124/78 (BP Location: Right Arm)   Pulse 85   Temp 98.6 F (37 C) (Oral)   Resp 18   Ht 5\' 9"  (1.753 m)   Wt 62.6 kg   SpO2 100%   BMI 20.38 kg/m   Physical Exam Vitals and nursing note reviewed.  Constitutional:      Appearance: She is well-developed.  HENT:     Head: Normocephalic.  Cardiovascular:     Rate and Rhythm: Normal rate.     Pulses: Normal pulses. No decreased pulses.          Dorsalis pedis pulses are 2+ on the right side and 2+ on the left side.  Musculoskeletal:        General: Swelling and tenderness present. No deformity.     Right lower leg: Edema present.     Right ankle: Swelling and ecchymosis present. Tenderness present over the lateral malleolus. No proximal fibula tenderness. Decreased range of motion. Normal pulse.     Right Achilles Tendon:  Normal.     Comments: There is also tenderness and mild bruising at her right fifth tarsal metatarsal joint.  No deformity.  Skin:    General: Skin is warm and dry.     Findings: No lesion.  Neurological:     Mental Status: She is alert.     Sensory: No sensory deficit.    ED Results / Procedures / Treatments   Labs (all labs ordered are listed, but only abnormal results are displayed) Labs Reviewed - No data to display  EKG None  Radiology DG Ankle Complete Right  Result Date: 01/20/2021 CLINICAL DATA:  Twisting injury. EXAM: RIGHT ANKLE - COMPLETE 3+ VIEW; RIGHT FOOT COMPLETE - 3+ VIEW COMPARISON:  None. FINDINGS: The mineralization and alignment are normal. There is a minimally displaced transverse fracture through the tip of the lateral malleolus, best seen on the ankle radiographs. No other evidence of acute fracture or dislocation. The ankle mortise is not widened. The tarsal bones appear intact. The bones of the forefoot appear unremarkable. No evidence of foreign body or soft tissue emphysema. IMPRESSION: Minimally displaced fracture through the tip of the lateral malleolus. No evidence of acute fracture or dislocation in the foot. Electronically Signed   By: 01/22/2021 M.D.   On: 01/20/2021 17:58   DG Foot Complete Right  Result Date: 01/20/2021 CLINICAL DATA:  Twisting injury. EXAM: RIGHT ANKLE - COMPLETE 3+ VIEW; RIGHT FOOT COMPLETE - 3+ VIEW COMPARISON:  None. FINDINGS: The mineralization and alignment are normal. There is a minimally displaced transverse fracture through the tip of the lateral malleolus, best seen on the ankle radiographs. No other evidence of acute fracture or dislocation. The ankle mortise is not widened. The tarsal bones appear intact. The bones of the forefoot appear unremarkable. No evidence of foreign body or soft tissue emphysema. IMPRESSION: Minimally displaced fracture through the tip of the lateral malleolus. No evidence of acute fracture or  dislocation in the foot. Electronically Signed   By: 01/22/2021 M.D.   On: 01/20/2021 17:58    Procedures Procedures   Medications Ordered in ED Medications  HYDROcodone-acetaminophen (NORCO/VICODIN) 5-325 MG per tablet 1 tablet (has no administration in time range)    ED Course  I have reviewed the triage vital signs and the nursing notes.  Pertinent labs & imaging results that were available during my care of the patient were reviewed by me and considered in my medical decision making (see chart for details).  MDM Rules/Calculators/A&P                           Imaging reviewed and discussed with patient.  She was placed in a cam walker, she will continue to use the crutches she presented with.  Advised ice and elevation, continued ibuprofen.  She was given a prescription for small quantity of hydrocodone as well.  She was given a referral to Dr. Dallas Schimke for follow-up care however she does state that her family's orthopedic provider is [facility] although she has never been a patient there.  She can call the orthopedist of her choice.  She is neurovascular intact. Final Clinical Impression(s) / ED Diagnoses Final diagnoses:  Other closed fracture of distal end of right fibula, initial encounter    Rx / DC Orders ED Discharge Orders          Ordered    HYDROcodone-acetaminophen (NORCO/VICODIN) 5-325 MG tablet  Every 6 hours PRN        01/20/21 1826             Burgess Amor, PA-C 01/20/21 1833    Vanetta Mulders, MD 01/26/21 0001

## 2021-01-20 NOTE — Discharge Instructions (Addendum)
Wear the walking boot to protect this injury as discussed.  You can weight-bear if it is comfortable to do so, I would otherwise continue to use your home crutches as needed.  Ice and elevation for the next several days is much as possible will help with the pain and swelling as well.  You will need follow-up care for this injury.  You have been referred to a local orthopedist who is on-call for [place] today.  I would recommend calling Dr. Dallas Schimke if you are unable to be seen by your father's orthopedist at [facility].  You have been prescribed a small quantity of hydrocodone to help with pain, use caution with this as it will make you drowsy.  Do not drive within 4 hours of taking this medication.  You may continue taking your ibuprofen, I suggest taking the 800 mg strength twice daily with a snack or meal.

## 2021-01-24 ENCOUNTER — Other Ambulatory Visit: Payer: Self-pay

## 2021-01-24 ENCOUNTER — Encounter: Payer: Self-pay | Admitting: Orthopedic Surgery

## 2021-01-24 ENCOUNTER — Ambulatory Visit: Payer: BC Managed Care – PPO | Admitting: Orthopedic Surgery

## 2021-01-24 VITALS — BP 155/97 | HR 94 | Ht 69.0 in | Wt 138.0 lb

## 2021-01-24 DIAGNOSIS — S82831A Other fracture of upper and lower end of right fibula, initial encounter for closed fracture: Secondary | ICD-10-CM | POA: Diagnosis not present

## 2021-01-24 MED ORDER — HYDROCODONE-ACETAMINOPHEN 5-325 MG PO TABS: 1.0000 | ORAL_TABLET | Freq: Four times a day (QID) | ORAL | 0 refills | Status: AC | PRN

## 2021-01-24 NOTE — Progress Notes (Signed)
New Patient Visit  Assessment: Lindsay [name] is a 45 y.o. female with the following: 1. Other closed fracture of distal end of right fibula, initial encounter  Plan: She sustained a right distal fibula fracture, approximately 5 days ago.  She continues to have swelling, bruising and pain as a result.  Reviewed radiographs with the patient in clinic today, and I am not concerned about the stability of the ankle.  Recommended she continue to remain weightbearing as tolerated in a cam walking boot.  I understand that she continues to have pain, and may not be able to bear weight, but I think the ankle is stable enough at this point.  She stated understanding.  The cam walking boot was fitted in clinic today.  I have provided her with a limited refill of hydrocodone, to help with her acute pain.  Otherwise, she can continue to take ibuprofen, limiting this to 800 mg, 3 times daily.  She can use ice and/or heat as tolerated.  Elevate her foot is much as possible.   Follow-up: Return in about 2 weeks (around 02/07/2021).  Subjective:  Chief Complaint  Patient presents with   Ankle Injury    Rt ankle DOI 01/20/21    History of Present Illness: Lindsay [name] is a 44 y.o. female who presents for evaluation of her right ankle pain.  She was starting to decorate for Christmas, when she fell off of a stool, and twisted her ankle.  She noted immediate pain, as well as a popping sensation.  She presented to the emergency department, where x-rays demonstrated an acute distal fibula fracture.  No other injuries are noted.  She was given a cam walking boot, but continues to have pain in the right ankle.  She has not been able to bear weight.  She notes swelling and bruising in the ankle.  She is been taking ibuprofen, more than is recommended.  She is also been taking hydrocodone at nighttime, to help her sleep.   Review of Systems: No fevers or chills No numbness or tingling No chest pain No shortness  of breath No bowel or bladder dysfunction No GI distress No headaches   Medical History:  Past Medical History:  Diagnosis Date   Anxiety     Past Surgical History:  Procedure Laterality Date   DENTAL EXAMINATION UNDER ANESTHESIA     DENTAL SURGERY      No family history on file. Social History   Tobacco Use   Smoking status: Never   Smokeless tobacco: Never  Substance Use Topics   Alcohol use: No   Drug use: No    Allergies  Allergen Reactions   Labetalol Other (See Comments)    "it was like I had been drinking", hypotension, slurring words, lightheaded    Current Meds  Medication Sig   HYDROcodone-acetaminophen (NORCO/VICODIN) 5-325 MG tablet Take 1 tablet by mouth every 6 (six) hours as needed for moderate pain.    Objective: BP (!) 155/97   Pulse 94   Ht 5\' 9"  (1.753 m)   Wt 138 lb (62.6 kg)   BMI 20.38 kg/m   Physical Exam:  General: Alert and oriented. and No acute distress. Gait: Unable to ambulate.  Evaluation of the right ankle demonstrates diffuse swelling over the lateral aspect of the right ankle extending distally into the lateral midfoot.  She has tenderness to palpation over the lateral ankle and lateral foot.  Sensations intact over the dorsum of her foot.  Toes are warm  and well-perfused.  No tenderness to palpation over the medial malleolus.  IMAGING: I personally reviewed images previously obtained from the ED   X-ray of the right ankle from the emergency department demonstrates a minimally displaced distal fibula fracture.  Mortise remains intact.  No widening of the medial clear space.  New Medications:  Meds ordered this encounter  Medications   HYDROcodone-acetaminophen (NORCO/VICODIN) 5-325 MG tablet    Sig: Take 1 tablet by mouth every 6 (six) hours as needed for moderate pain.    Dispense:  15 tablet    Refill:  0      Oliver Barre, MD  01/24/2021 11:12 AM

## 2021-02-07 ENCOUNTER — Other Ambulatory Visit: Payer: Self-pay

## 2021-02-07 ENCOUNTER — Ambulatory Visit (INDEPENDENT_AMBULATORY_CARE_PROVIDER_SITE_OTHER): Payer: BC Managed Care – PPO | Admitting: Orthopedic Surgery

## 2021-02-07 ENCOUNTER — Ambulatory Visit: Payer: BC Managed Care – PPO

## 2021-02-07 ENCOUNTER — Encounter: Payer: Self-pay | Admitting: Orthopedic Surgery

## 2021-02-07 DIAGNOSIS — S82831D Other fracture of upper and lower end of right fibula, subsequent encounter for closed fracture with routine healing: Secondary | ICD-10-CM

## 2021-02-07 NOTE — Patient Instructions (Addendum)
No weightbearing in a boot for 2 more weeks  Then, can start bearing some weight on your ankle in the boot  F/u 4 weeks  Ok for gentle range of motion

## 2021-02-07 NOTE — Progress Notes (Signed)
Orthopaedic Clinic Return  Assessment: Lindsay [name] is a 45 y.o. female with the following: Minimally displaced right distal fibula fracture   Plan: Radiographs obtained in clinic today demonstrate stable alignment of a distal fibula fracture.  On physical exam, she has swelling and bruising over the lateral midfoot, with tenderness to palpation.  It is possible that she has sustained an injury about the base of the fifth metatarsal as well.  Continue to remain nonweightbearing for an additional 2 weeks.  She should continue to wear the boot at all times when she is ambulating.  Okay to remove the boot and start working on gentle range of motion.  In approximately 2 weeks, she can start bearing weight.  She should remain in the boot, as she initiates that her weightbearing.  We will see her back in approximate 4 weeks for repeat evaluation.    Follow-up: Return in about 4 weeks (around 03/07/2021).   Subjective:  Chief Complaint  Patient presents with   Ankle Injury    01/20/21 Right ankle fracture improving     History of Present Illness: Lindsay [name] is a 45 y.o. female who returns to clinic for repeat evaluation of a right ankle fracture.  She fell and sustained a distal fibula fracture approximately 2-3 weeks ago.  She has been nonweightbearing, in a walking boot.  She continues to improve.  Pain is improving.  Swelling is improving.  She has been bearing some weight, within her house.  But she has been bearing limited weight otherwise.  Review of Systems: No fevers or chills No numbness or tingling No chest pain No shortness of breath No bowel or bladder dysfunction No GI distress No headaches   Objective: There were no vitals taken for this visit.  Physical Exam:  Alert and oriented.  No acute distress.  Ambulating with the assistance of a knee Rollator.  Nonweightbearing on the right lower extremity.  Evaluation of the right ankle demonstrates a mild diffuse  swelling.  Mild ecchymosis is appreciated.  Sensations intact over the dorsum of the foot.  Mild tenderness to palpation over the distal fibula.  Toes are warm and well-perfused.  2+ DP pulse.  IMAGING: I personally ordered and reviewed the following images:  X-ray of the right ankle demonstrates a minimally displaced distal fibula fracture.  The fracture fragments very small.  Overall alignment remains unchanged.  Mortise is intact.  No syndesmotic disruption.  No additional injuries are noted.  Impression: Healing right distal fibula fracture, in stable alignment  Oliver Barre, MD 02/07/2021 9:46 AM

## 2021-03-07 ENCOUNTER — Encounter: Payer: Self-pay | Admitting: Orthopedic Surgery

## 2021-03-07 ENCOUNTER — Other Ambulatory Visit: Payer: Self-pay

## 2021-03-07 ENCOUNTER — Ambulatory Visit: Payer: BC Managed Care – PPO

## 2021-03-07 ENCOUNTER — Ambulatory Visit (INDEPENDENT_AMBULATORY_CARE_PROVIDER_SITE_OTHER): Payer: BC Managed Care – PPO | Admitting: Orthopedic Surgery

## 2021-03-07 VITALS — Ht 69.0 in | Wt 138.0 lb

## 2021-03-07 DIAGNOSIS — S82831D Other fracture of upper and lower end of right fibula, subsequent encounter for closed fracture with routine healing: Secondary | ICD-10-CM

## 2021-03-07 NOTE — Progress Notes (Signed)
Orthopaedic Clinic Return  Assessment: Lindsay [name] is a 46 y.o. female with the following: Minimally displaced right distal fibula fracture   Plan: X-rays are stable.  Her pain is improving. She can transition out of the walking boot, to a regular shoe Continue to work on range of motion and strengthening exercises. Elevate the foot to improve swelling. If she is comfortable, she can start to drive again in a regular shoe We will see her back in 6 weeks, at which time I anticipate that she will be walking with full weightbearing in a regular shoe.   Follow-up: Return in about 6 weeks (around 04/18/2021).   Subjective:  Chief Complaint  Patient presents with   Fracture    Rt ankle DOI 01/20/21    History of Present Illness: Lindsay [name] is a 46 y.o. female who returns to clinic for repeat evaluation of a right ankle fracture.  Her injury was sustained approximately 6 weeks ago.  Overall, her pain and swelling has improved.  She does have occasional sharp pains, with sudden onset.  These are consistent with a muscle spasm.  She has been bearing some weight with a walking boot on.  She has been doing some exercises at home.  She notes stiffness in her ankle with some these exercises.  Review of Systems: No fevers or chills No numbness or tingling No chest pain No shortness of breath No bowel or bladder dysfunction No GI distress No headaches   Objective: Ht 5\' 9"  (1.753 m)    Wt 138 lb (62.6 kg)    BMI 20.38 kg/m   Physical Exam:  Alert and oriented.  No acute distress.  Ambulating in the walking boot, using 1 crutch to assist.  Evaluation of the right foot and ankle demonstrates improved swelling.  Some persistent swelling over the base of the fifth metatarsal.  Mild tenderness to palpation over the distal fibula.  She is able to get to a plantigrade position, with stiffness noted beyond a neutral position.  Slightly decreased sensation over the dorsum of the  foot.  IMAGING: I personally ordered and reviewed the following images:  X-ray of the right ankle demonstrates a small fracture at the distal extent of the fibula.  There has been some interval consolidation at the fracture site.  No further distraction is noted.  Mortise remains intact.  No syndesmotic disruption.  No acute injuries are noted.  Impression: Healing right distal fibula fracture  , MD 03/07/2021 9:50 AM

## 2021-03-07 NOTE — Patient Instructions (Signed)
Ok to transition to a regular shoe  Continue to work on strengthening the ankle  We can send you to PT at any time  Can go online a look for ankle sprain rehab, or ankle fracture rehab  Medications as needed  Elevate the foot to help with swelling

## 2021-04-04 ENCOUNTER — Ambulatory Visit: Payer: BC Managed Care – PPO

## 2021-04-04 ENCOUNTER — Encounter: Payer: Self-pay | Admitting: Orthopedic Surgery

## 2021-04-04 ENCOUNTER — Other Ambulatory Visit: Payer: Self-pay

## 2021-04-04 ENCOUNTER — Ambulatory Visit (INDEPENDENT_AMBULATORY_CARE_PROVIDER_SITE_OTHER): Payer: BC Managed Care – PPO | Admitting: Orthopedic Surgery

## 2021-04-04 VITALS — Ht 69.0 in | Wt 138.0 lb

## 2021-04-04 DIAGNOSIS — S82831D Other fracture of upper and lower end of right fibula, subsequent encounter for closed fracture with routine healing: Secondary | ICD-10-CM | POA: Diagnosis not present

## 2021-04-04 NOTE — Progress Notes (Signed)
Orthopaedic Clinic Return  Assessment: Lindsay [name] is a 46 y.o. female with the following: Minimally displaced right distal fibula fracture   Plan: Radiographs remain unchanged.  She has minimal pain.  She is slowly returning to her usual activities.  She does admit to some reluctance, but I provided her with reassurance.  Gradually increase her level of activities.  No restrictions at this time.  She is comfortable contacting clinic if she has any issues.  No follow-up is needed at this time.   Follow-up: Return if symptoms worsen or fail to improve.   Subjective:  Chief Complaint  Patient presents with   Fracture    Rt ankle DOI 01/20/21    History of Present Illness: Lindsay [name] is a 46 y.o. female who returns to clinic for repeat evaluation of a right ankle fracture.  Her injury was sustained approximately 10 weeks ago.  She has minimal pain most the time.  She states that she was doing well over the weekend, but had some worsening pain yesterday.  Since then, her pain is improved.  She has limited swelling.  She is transition to regular shoe.  She notes occasional swelling.  She has been doing exercises on her own.  She is reluctant to return to driving.  She remains hopeful to return to running.   Review of Systems: No fevers or chills No numbness or tingling No chest pain No shortness of breath No bowel or bladder dysfunction No GI distress No headaches   Objective: Ht 5\' 9"  (1.753 m)    Wt 138 lb (62.6 kg)    BMI 20.38 kg/m   Physical Exam:  Alert and oriented.  No acute distress.  Ambulating in regular shoes.  Mild antalgic gait.  Right foot and ankle with minimal swelling.  No tenderness to palpation over the distal fibula.  10 degrees dorsiflexion with an extended knee.  Sensation over the dorsum of her foot is intact.  Toes are warm and well-perfused.  IMAGING: I personally ordered and reviewed the following images:  X-ray of the right ankle was  obtained in clinic today.  This was compared to prior x-rays.  There has been interval consolidation of the distal fibula fracture.  The fracture fragment remains apparent.  Mortise remains intact.  No syndesmotic disruption.  No acute injuries are noted.  Impression: Healing right distal fibula fracture  Mordecai Rasmussen, MD 04/04/2021 11:40 AM

## 2022-04-26 ENCOUNTER — Encounter: Payer: Self-pay | Admitting: Radiology

## 2022-07-05 DIAGNOSIS — F411 Generalized anxiety disorder: Secondary | ICD-10-CM | POA: Diagnosis not present

## 2022-07-05 DIAGNOSIS — F4310 Post-traumatic stress disorder, unspecified: Secondary | ICD-10-CM | POA: Diagnosis not present

## 2022-12-01 IMAGING — DX DG ANKLE COMPLETE 3+V*R*
3 series · 3 of 3 positions shown · non-contrast
Comparison: None.

CLINICAL DATA: Twisting injury.

EXAM:
RIGHT ANKLE - COMPLETE 3+ VIEW; RIGHT FOOT COMPLETE - 3+ VIEW

[ankle ap]
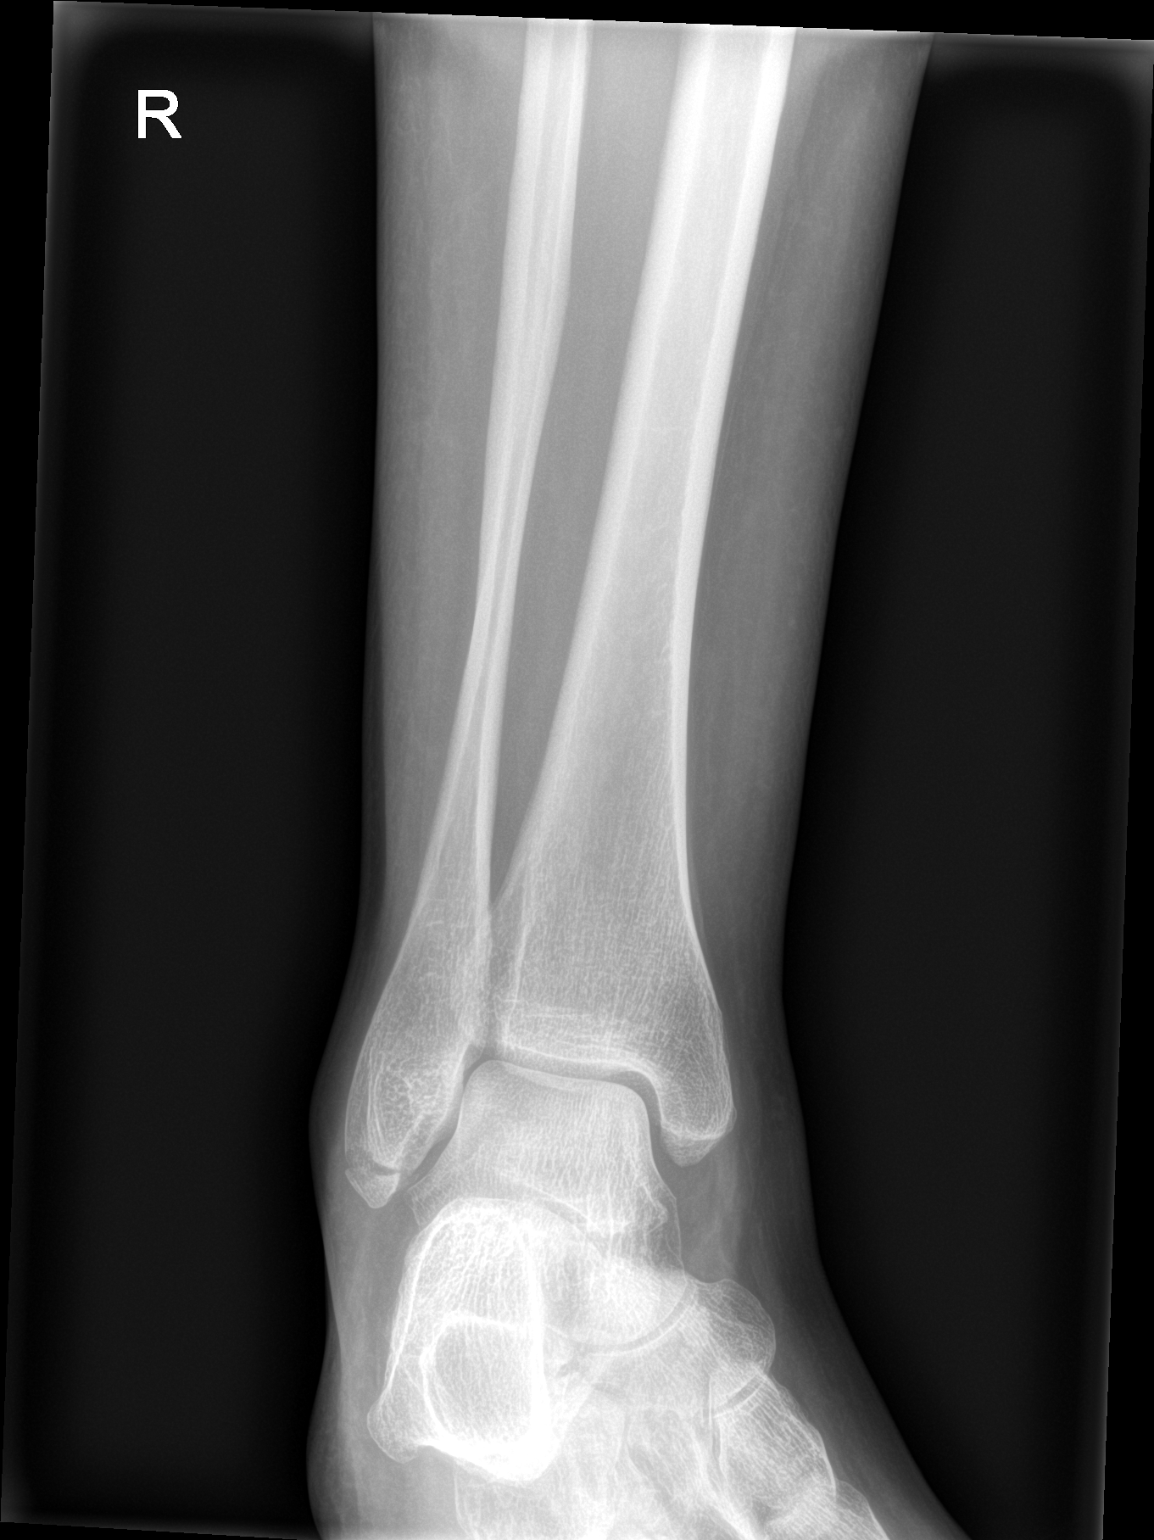

[ankle obl]
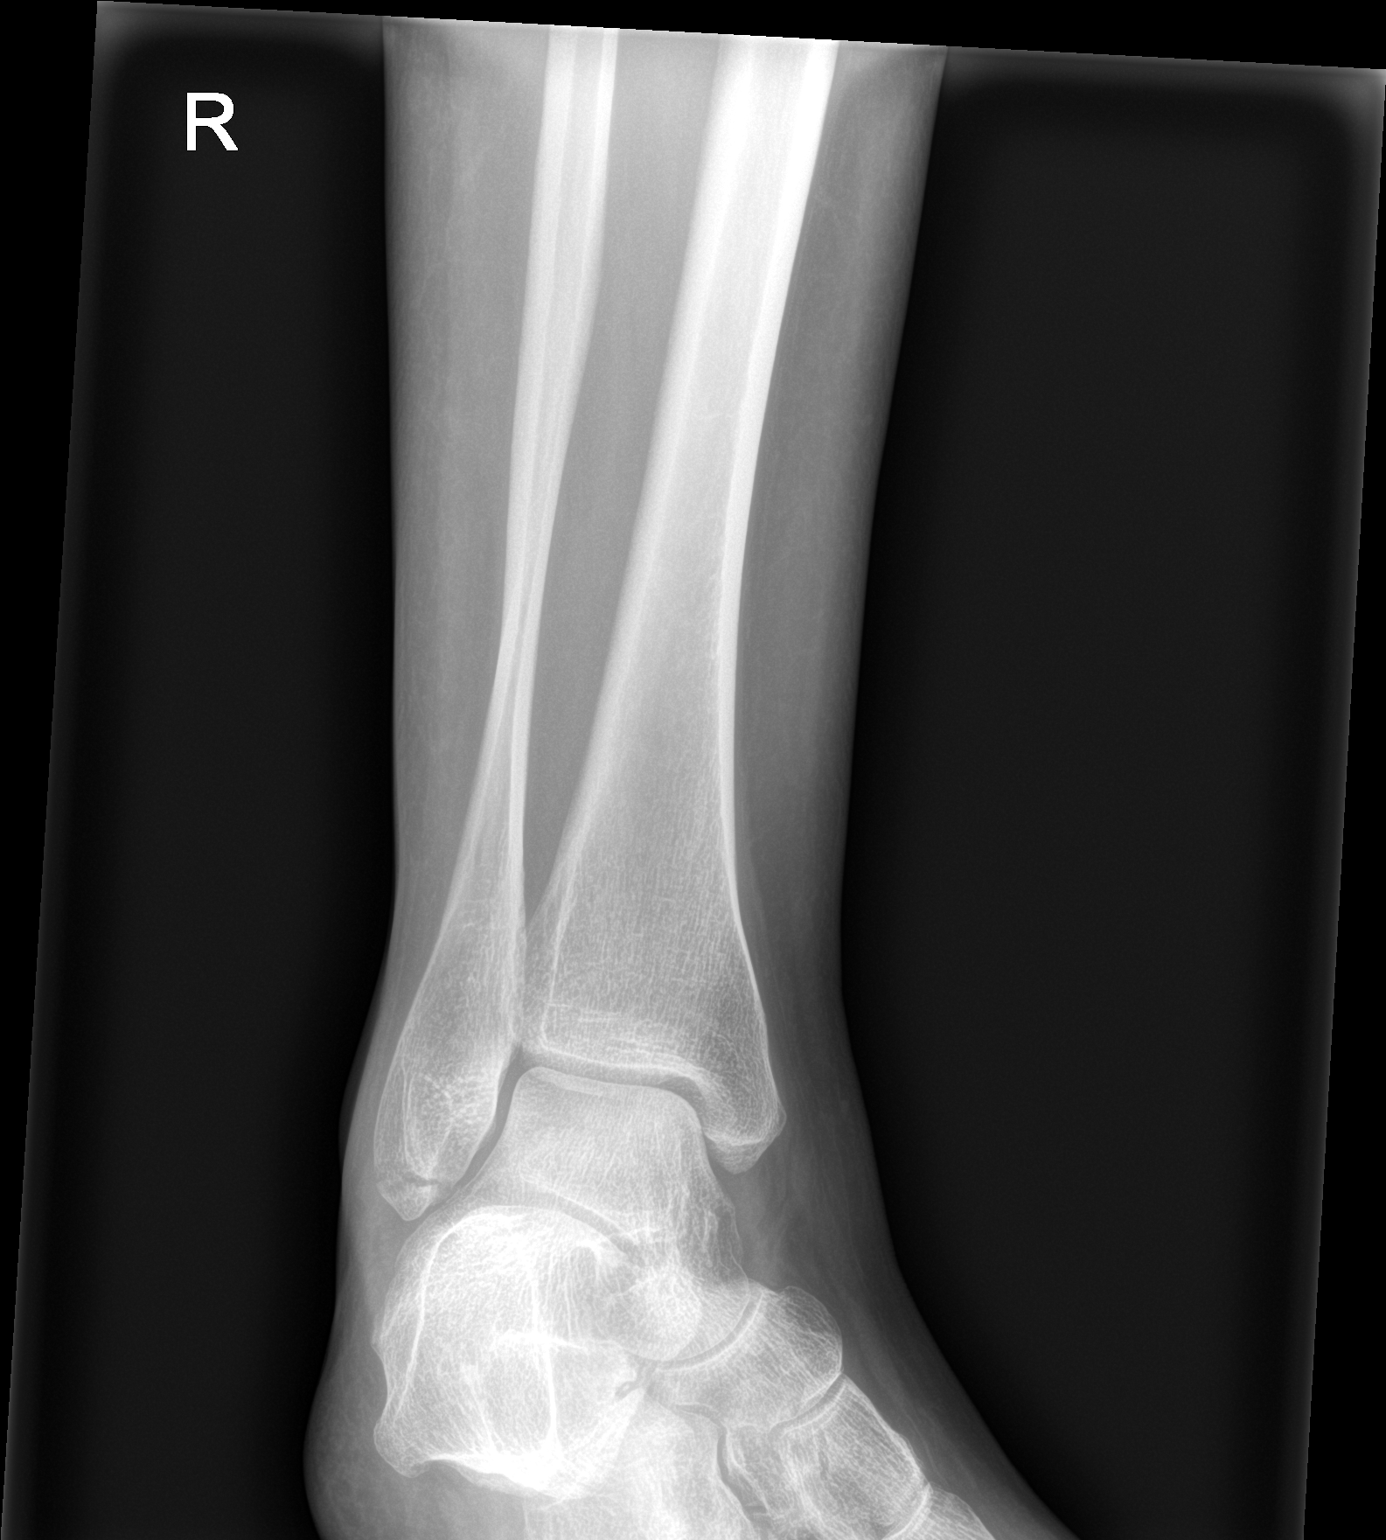

[ankle lat]
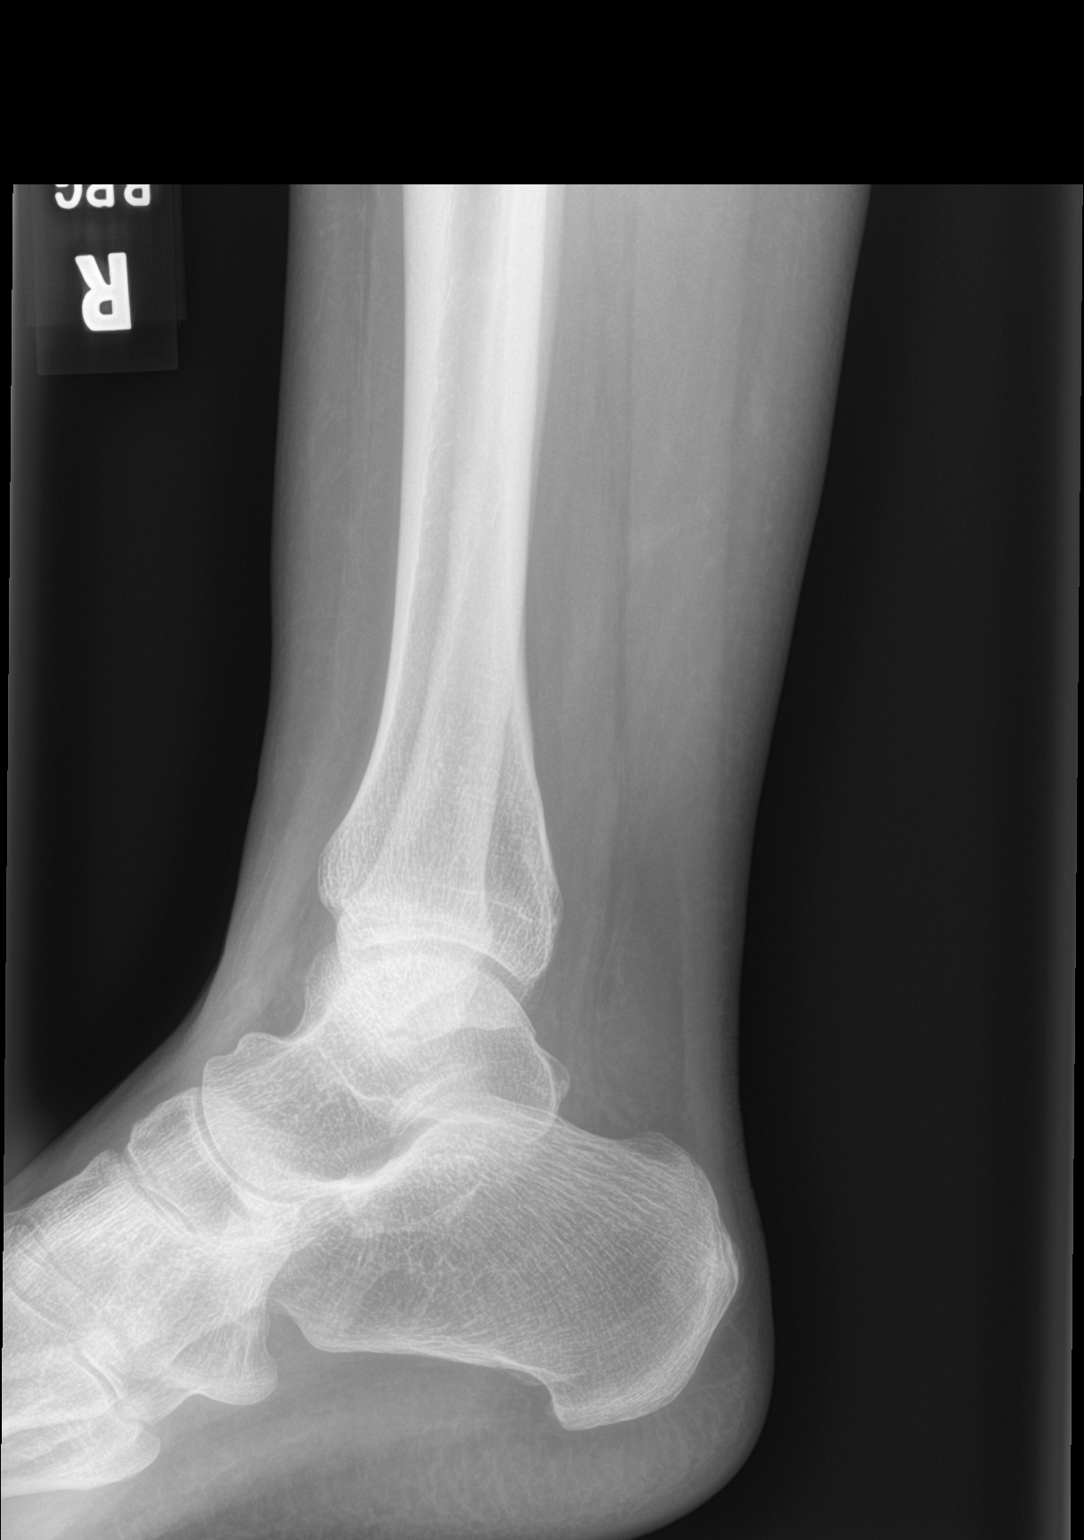

[3 of 3 positions shown; findings below may reference images not displayed]

FINDINGS: The mineralization and alignment are normal. There is a minimally
displaced transverse fracture through the tip of the lateral
malleolus, best seen on the ankle radiographs. No other evidence of
acute fracture or dislocation. The ankle mortise is not widened. The
tarsal bones appear intact. The bones of the forefoot appear
unremarkable. No evidence of foreign body or soft tissue emphysema.
IMPRESSION: Minimally displaced fracture through the tip of the lateral
malleolus. No evidence of acute fracture or dislocation in the foot.

## 2022-12-01 IMAGING — DX DG FOOT COMPLETE 3+V*R*
3 series · 3 of 3 positions shown · non-contrast
Comparison: None.

CLINICAL DATA: Twisting injury.

EXAM:
RIGHT ANKLE - COMPLETE 3+ VIEW; RIGHT FOOT COMPLETE - 3+ VIEW

[foot ap]
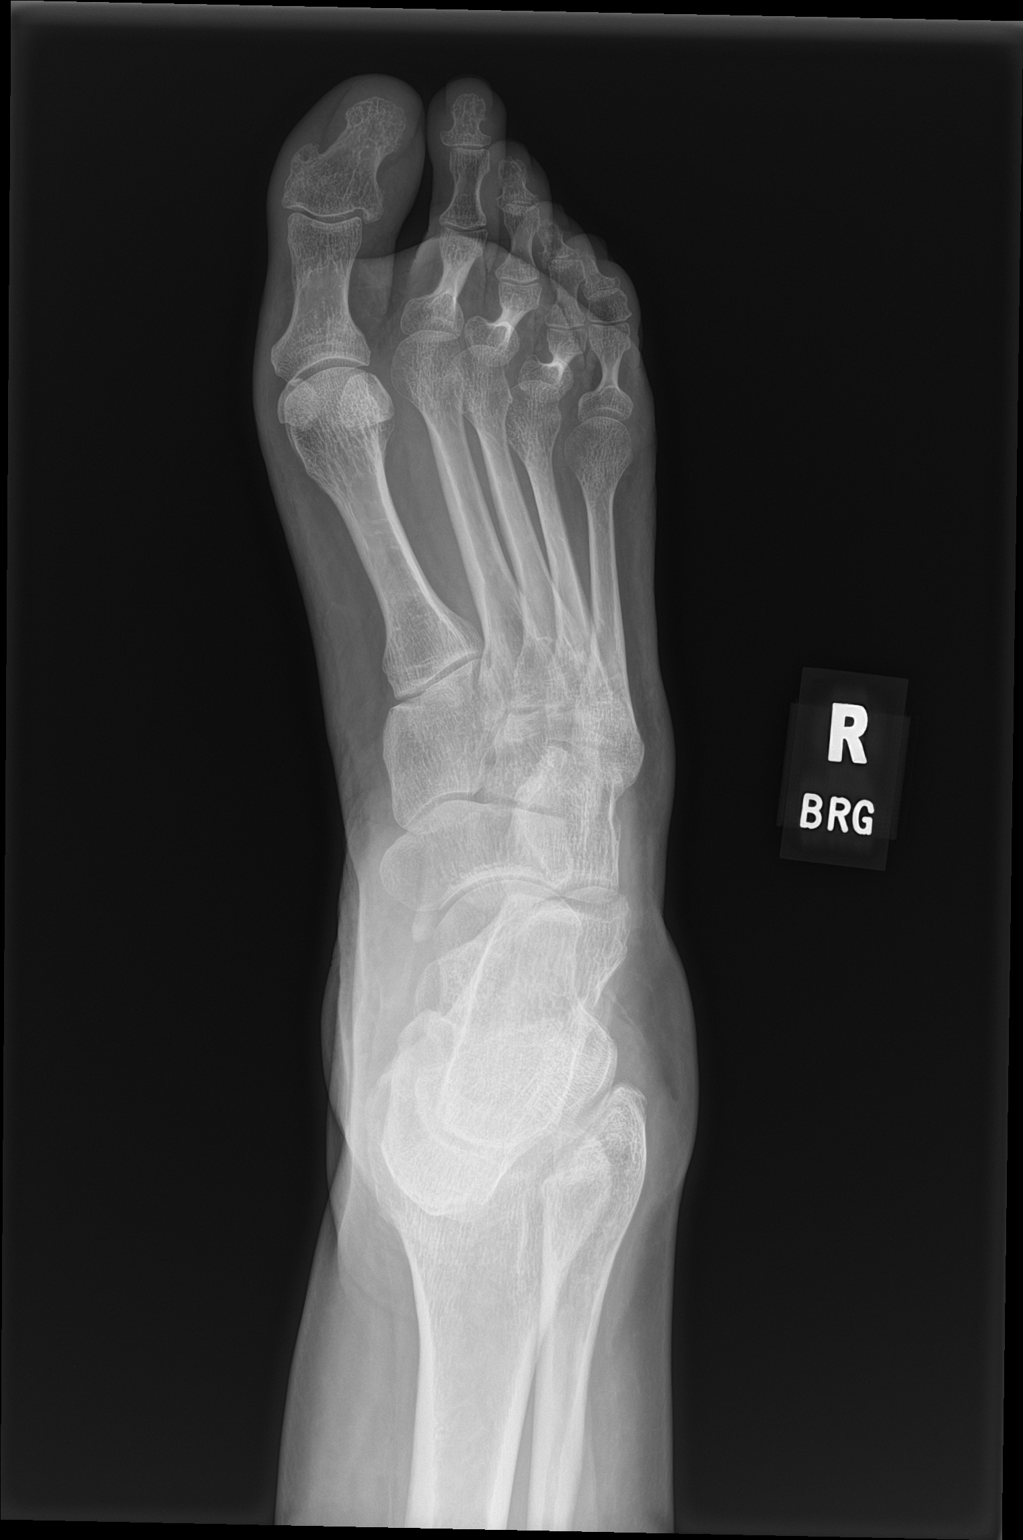

[foot lat]
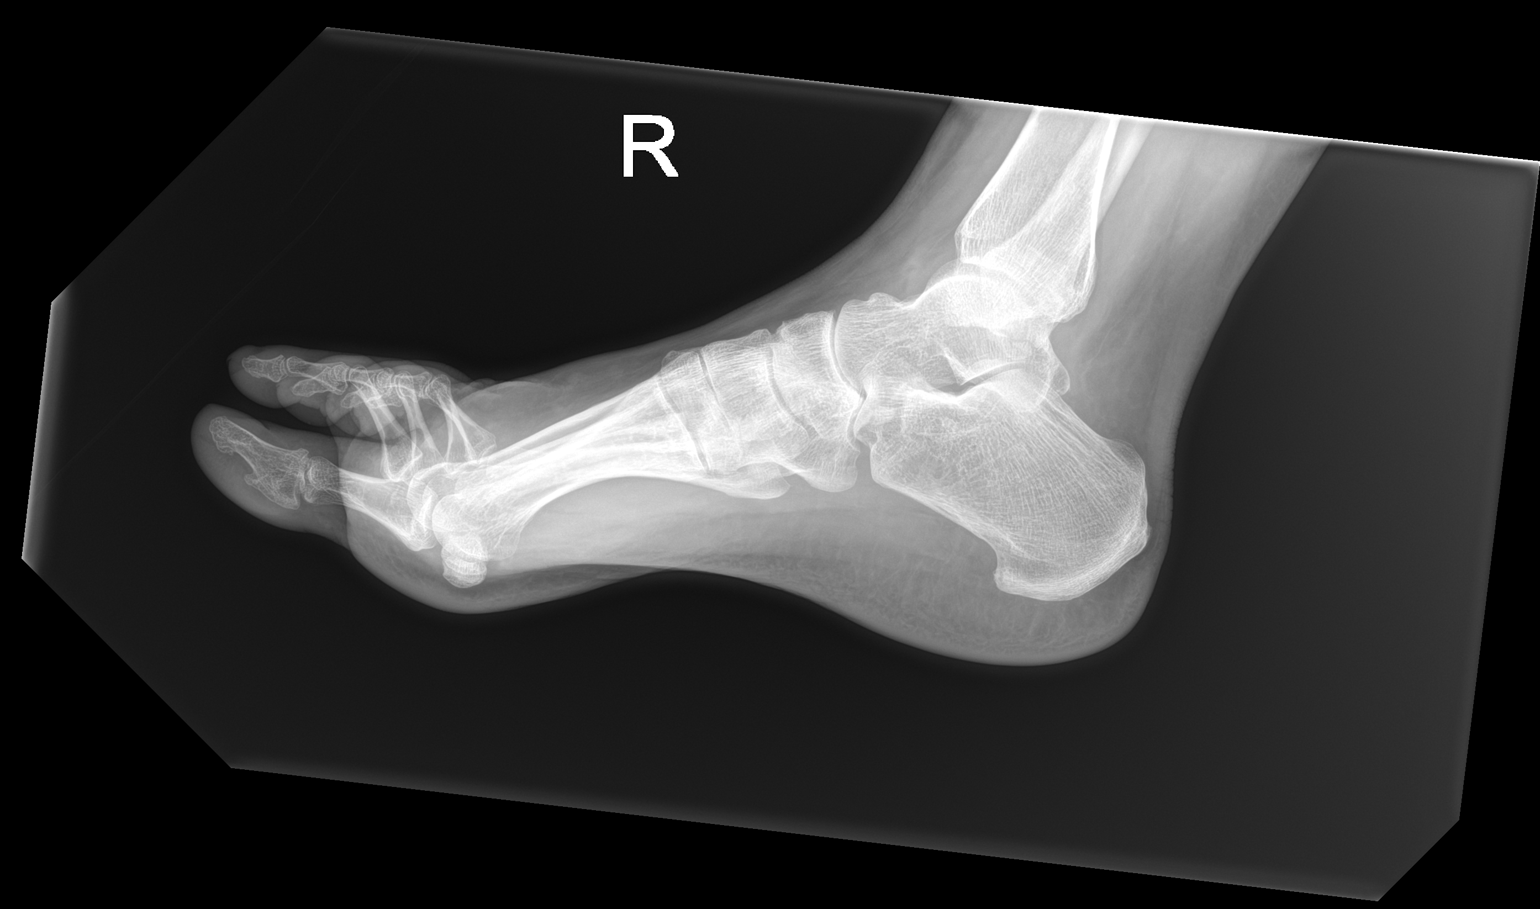

[foot obl]
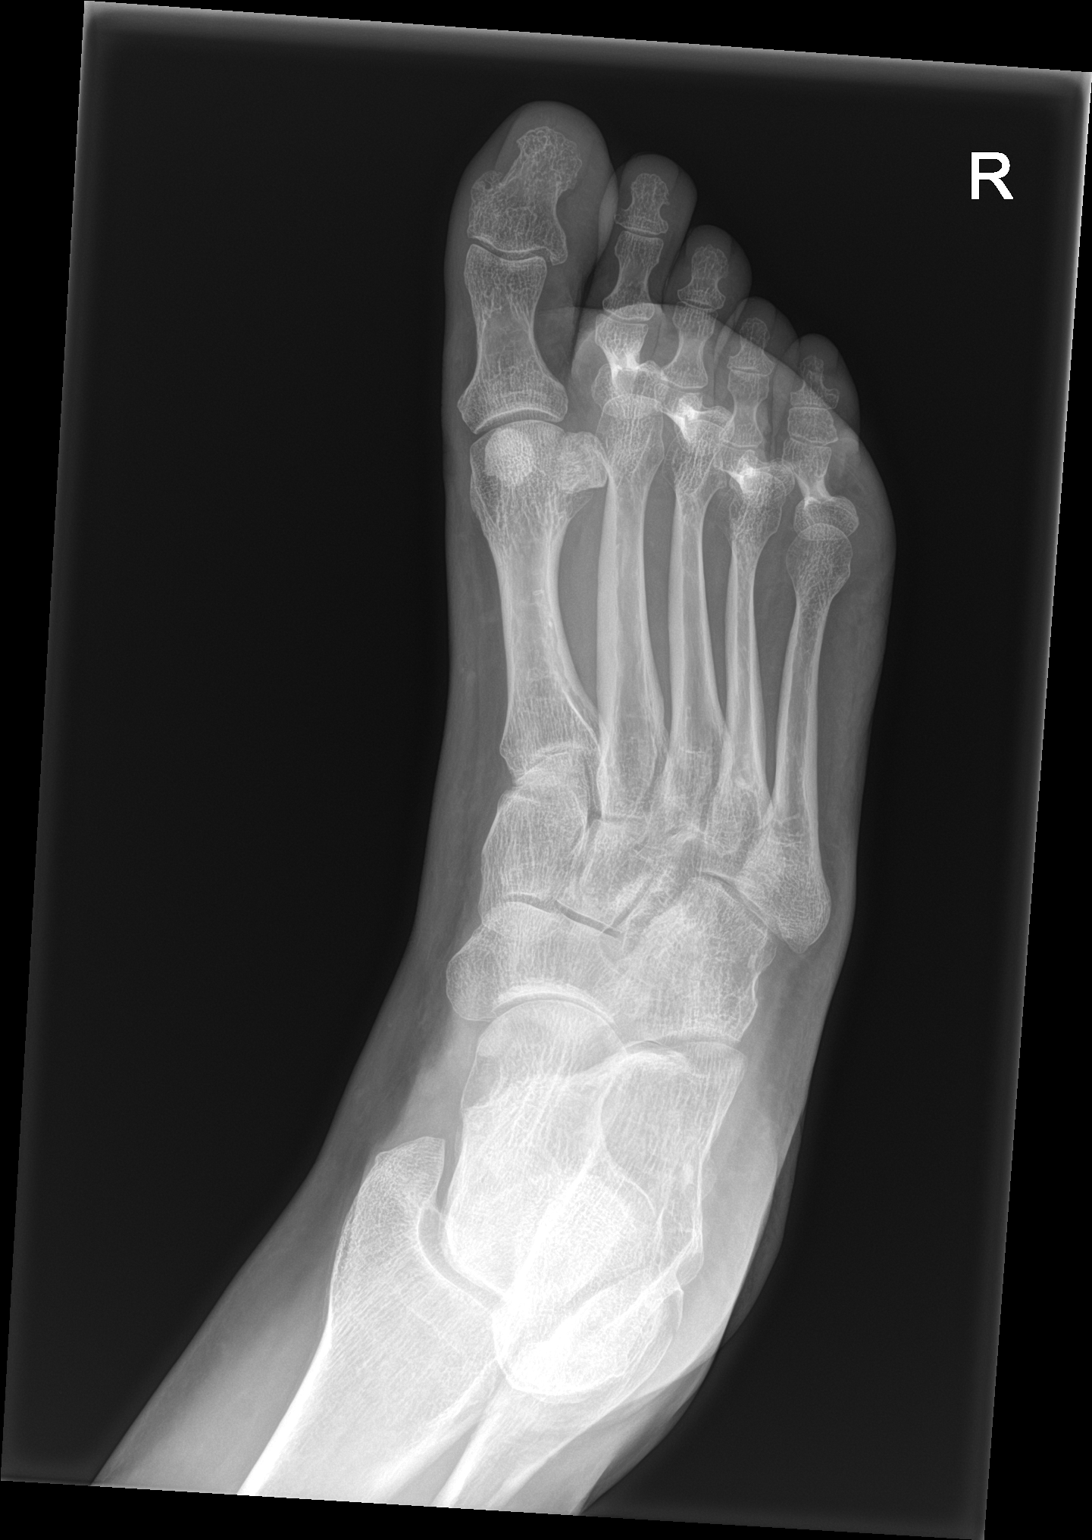

[3 of 3 positions shown; findings below may reference images not displayed]

FINDINGS: The mineralization and alignment are normal. There is a minimally
displaced transverse fracture through the tip of the lateral
malleolus, best seen on the ankle radiographs. No other evidence of
acute fracture or dislocation. The ankle mortise is not widened. The
tarsal bones appear intact. The bones of the forefoot appear
unremarkable. No evidence of foreign body or soft tissue emphysema.
IMPRESSION: Minimally displaced fracture through the tip of the lateral
malleolus. No evidence of acute fracture or dislocation in the foot.

## 2023-01-01 DIAGNOSIS — F4310 Post-traumatic stress disorder, unspecified: Secondary | ICD-10-CM | POA: Diagnosis not present

## 2023-01-01 DIAGNOSIS — F411 Generalized anxiety disorder: Secondary | ICD-10-CM | POA: Diagnosis not present

## 2023-07-02 DIAGNOSIS — F4310 Post-traumatic stress disorder, unspecified: Secondary | ICD-10-CM | POA: Diagnosis not present

## 2023-07-02 DIAGNOSIS — Z5181 Encounter for therapeutic drug level monitoring: Secondary | ICD-10-CM | POA: Diagnosis not present

## 2023-07-02 DIAGNOSIS — O906 Postpartum mood disturbance: Secondary | ICD-10-CM | POA: Diagnosis not present

## 2023-07-02 DIAGNOSIS — F411 Generalized anxiety disorder: Secondary | ICD-10-CM | POA: Diagnosis not present

## 2024-01-02 DIAGNOSIS — F4310 Post-traumatic stress disorder, unspecified: Secondary | ICD-10-CM | POA: Diagnosis not present

## 2024-01-02 DIAGNOSIS — F411 Generalized anxiety disorder: Secondary | ICD-10-CM | POA: Diagnosis not present
